# Patient Record
Sex: Male | Born: 1955 | Race: White | Hispanic: No | Marital: Single | Smoking: Former smoker
Health system: Southern US, Community
[De-identification: ages and names within clinical notes are randomized; demographics above are authoritative.]

## PROBLEM LIST (undated history)

## (undated) DIAGNOSIS — K219 Gastro-esophageal reflux disease without esophagitis: Secondary | ICD-10-CM

## (undated) DIAGNOSIS — I10 Essential (primary) hypertension: Secondary | ICD-10-CM

## (undated) DIAGNOSIS — M1990 Unspecified osteoarthritis, unspecified site: Secondary | ICD-10-CM

## (undated) DIAGNOSIS — R200 Anesthesia of skin: Secondary | ICD-10-CM

## (undated) DIAGNOSIS — Z972 Presence of dental prosthetic device (complete) (partial): Secondary | ICD-10-CM

## (undated) HISTORY — PX: HERNIA REPAIR: SHX51

---

## 2008-01-26 ENCOUNTER — Other Ambulatory Visit: Payer: Self-pay

## 2008-01-26 ENCOUNTER — Emergency Department: Payer: Self-pay | Admitting: Emergency Medicine

## 2008-08-09 ENCOUNTER — Ambulatory Visit: Payer: Self-pay | Admitting: Internal Medicine

## 2009-06-22 ENCOUNTER — Emergency Department: Payer: Self-pay | Admitting: Emergency Medicine

## 2009-08-20 HISTORY — PX: COLONOSCOPY: SHX174

## 2013-11-28 ENCOUNTER — Ambulatory Visit: Payer: Self-pay

## 2014-03-12 ENCOUNTER — Ambulatory Visit: Payer: Self-pay | Admitting: Physician Assistant

## 2014-03-12 LAB — CBC WITH DIFFERENTIAL/PLATELET
BASOS PCT: 1.1 %
Basophil #: 0.1 10*3/uL (ref 0.0–0.1)
EOS ABS: 0 10*3/uL (ref 0.0–0.7)
EOS PCT: 0.5 %
HCT: 49.5 % (ref 40.0–52.0)
HGB: 17.2 g/dL (ref 13.0–18.0)
LYMPHS PCT: 23.2 %
Lymphocyte #: 1.2 10*3/uL (ref 1.0–3.6)
MCH: 32.4 pg (ref 26.0–34.0)
MCHC: 34.8 g/dL (ref 32.0–36.0)
MCV: 93 fL (ref 80–100)
Monocyte #: 0.9 x10 3/mm (ref 0.2–1.0)
Monocyte %: 17.2 %
Neutrophil #: 3 10*3/uL (ref 1.4–6.5)
Neutrophil %: 58 %
PLATELETS: 149 10*3/uL — AB (ref 150–440)
RBC: 5.3 10*6/uL (ref 4.40–5.90)
RDW: 12.7 % (ref 11.5–14.5)
WBC: 5.2 10*3/uL (ref 3.8–10.6)

## 2014-03-12 LAB — RAPID STREP-A WITH REFLX: Micro Text Report: POSITIVE

## 2014-03-12 LAB — MONONUCLEOSIS SCREEN: Mono Test: NEGATIVE

## 2014-04-21 ENCOUNTER — Ambulatory Visit: Payer: Self-pay

## 2014-04-21 LAB — DOT URINE DIP
BLOOD: NEGATIVE
Glucose,UR: NEGATIVE
Protein: NEGATIVE
Specific Gravity: 1.02 (ref 1.000–1.030)

## 2015-04-15 ENCOUNTER — Ambulatory Visit
Admission: EM | Admit: 2015-04-15 | Discharge: 2015-04-15 | Disposition: A | Discharge status: Home / Self Care | Payer: Self-pay | Attending: Family Medicine | Admitting: Family Medicine

## 2015-04-15 DIAGNOSIS — Z0289 Encounter for other administrative examinations: Secondary | ICD-10-CM

## 2015-04-15 HISTORY — DX: Essential (primary) hypertension: I10

## 2015-04-15 LAB — DEPT OF TRANSP DIPSTICK, URINE (ARMC ONLY)
GLUCOSE, UA: NEGATIVE mg/dL
Hgb urine dipstick: NEGATIVE
Protein, ur: NEGATIVE mg/dL
SPECIFIC GRAVITY, URINE: 1.025 (ref 1.005–1.030)

## 2015-04-15 NOTE — ED Notes (Signed)
Pt states "I am here for DOT physical."

## 2015-04-15 NOTE — ED Provider Notes (Signed)
CSN: [id number]     Arrival date & time 04/15/15  1059 History   First MD Initiated Contact with Patient 04/15/15 1146     Chief Complaint  Patient presents with  . Commercial Driver's License Exam   (Consider location/radiation/quality/duration/timing/severity/associated sxs/prior Treatment) HPI Comments: Patient here for DOT Physical (see scanned form)   The history is provided by the patient.    Past Medical History  Diagnosis Date  . Hypertension    Past Surgical History  Procedure Laterality Date  . Hernia repair     History reviewed. No pertinent family history. Social History  Substance Use Topics  . Smoking status: Never Smoker   . Smokeless tobacco: None  . Alcohol Use: Yes     Comment: social    Review of Systems  Allergies  Review of patient's allergies indicates no known allergies.  Home Medications   Prior to Admission medications   Not on File   Meds Ordered and Administered this Visit  Medications - No data to display  BP 136/84 mmHg  Pulse 78  Temp(Src) 98.2 F (36.8 C) (Tympanic)  Resp 16  Ht 6\' 1"  (1.854 m)  Wt 223 lb (101.152 kg)  BMI 29.43 kg/m2 No data found.   Physical Exam  ED Course  Procedures (including critical care time)  Labs Review Labs Reviewed  DEPT OF TRANSP DIPSTICK, URINE([facility] ONLY)    Imaging Review No results found.   Visual Acuity Review  Right Eye Distance:   Left Eye Distance:   Bilateral Distance:    Right Eye Near:   Left Eye Near:    Bilateral Near:         MDM   1. Encounter for examination required by Department of Transportation (DOT)    DOT Physical (medically qualified for 1 year; see scanned form)    Norval Gable, MD 04/15/15 1222

## 2015-08-01 ENCOUNTER — Other Ambulatory Visit: Payer: Self-pay | Admitting: Family Medicine

## 2015-08-01 ENCOUNTER — Ambulatory Visit (INDEPENDENT_AMBULATORY_CARE_PROVIDER_SITE_OTHER): Payer: BLUE CROSS/BLUE SHIELD | Admitting: Family Medicine

## 2015-08-01 ENCOUNTER — Ambulatory Visit
Admission: RE | Admit: 2015-08-01 | Discharge: 2015-08-01 | Disposition: A | Discharge status: Home / Self Care | Payer: BLUE CROSS/BLUE SHIELD | Source: Ambulatory Visit | Attending: Family Medicine | Admitting: Family Medicine

## 2015-08-01 ENCOUNTER — Encounter: Payer: Self-pay | Admitting: Family Medicine

## 2015-08-01 VITALS — BP 140/98 | HR 80 | Ht 73.0 in | Wt 227.0 lb

## 2015-08-01 DIAGNOSIS — N400 Enlarged prostate without lower urinary tract symptoms: Secondary | ICD-10-CM | POA: Diagnosis not present

## 2015-08-01 DIAGNOSIS — Z720 Tobacco use: Secondary | ICD-10-CM

## 2015-08-01 DIAGNOSIS — J029 Acute pharyngitis, unspecified: Secondary | ICD-10-CM

## 2015-08-01 DIAGNOSIS — R05 Cough: Secondary | ICD-10-CM | POA: Diagnosis present

## 2015-08-01 DIAGNOSIS — M542 Cervicalgia: Secondary | ICD-10-CM

## 2015-08-01 DIAGNOSIS — A630 Anogenital (venereal) warts: Secondary | ICD-10-CM | POA: Diagnosis not present

## 2015-08-01 DIAGNOSIS — M79602 Pain in left arm: Secondary | ICD-10-CM

## 2015-08-01 DIAGNOSIS — M79603 Pain in arm, unspecified: Secondary | ICD-10-CM

## 2015-08-01 DIAGNOSIS — Z23 Encounter for immunization: Secondary | ICD-10-CM | POA: Diagnosis not present

## 2015-08-01 DIAGNOSIS — M79601 Pain in right arm: Secondary | ICD-10-CM | POA: Insufficient documentation

## 2015-08-01 DIAGNOSIS — E663 Overweight: Secondary | ICD-10-CM

## 2015-08-01 DIAGNOSIS — F17200 Nicotine dependence, unspecified, uncomplicated: Secondary | ICD-10-CM

## 2015-08-01 DIAGNOSIS — K635 Polyp of colon: Secondary | ICD-10-CM

## 2015-08-01 DIAGNOSIS — G8929 Other chronic pain: Secondary | ICD-10-CM

## 2015-08-01 DIAGNOSIS — Z8249 Family history of ischemic heart disease and other diseases of the circulatory system: Secondary | ICD-10-CM

## 2015-08-01 DIAGNOSIS — Z202 Contact with and (suspected) exposure to infections with a predominantly sexual mode of transmission: Secondary | ICD-10-CM | POA: Diagnosis not present

## 2015-08-01 DIAGNOSIS — R918 Other nonspecific abnormal finding of lung field: Secondary | ICD-10-CM | POA: Diagnosis not present

## 2015-08-01 DIAGNOSIS — I10 Essential (primary) hypertension: Secondary | ICD-10-CM | POA: Diagnosis not present

## 2015-08-01 DIAGNOSIS — Z8349 Family history of other endocrine, nutritional and metabolic diseases: Secondary | ICD-10-CM

## 2015-08-01 DIAGNOSIS — R202 Paresthesia of skin: Secondary | ICD-10-CM

## 2015-08-01 DIAGNOSIS — Z8639 Personal history of other endocrine, nutritional and metabolic disease: Secondary | ICD-10-CM

## 2015-08-01 DIAGNOSIS — R053 Chronic cough: Secondary | ICD-10-CM

## 2015-08-01 DIAGNOSIS — E559 Vitamin D deficiency, unspecified: Secondary | ICD-10-CM

## 2015-08-01 DIAGNOSIS — R748 Abnormal levels of other serum enzymes: Secondary | ICD-10-CM

## 2015-08-01 MED ORDER — AZITHROMYCIN 250 MG PO TABS: ORAL_TABLET | ORAL | Status: DC

## 2015-08-01 MED ORDER — LOSARTAN POTASSIUM 100 MG PO TABS: 100.0000 mg | ORAL_TABLET | Freq: Every day | ORAL | Status: DC

## 2015-08-01 MED ORDER — ZOSTER VACCINE LIVE 19400 UNT/0.65ML ~~LOC~~ SOLR: 0.6500 mL | Freq: Once | SUBCUTANEOUS | Status: DC

## 2015-08-01 NOTE — Progress Notes (Signed)
Date:  08/01/2015   Name:  Randall Bell   DOB:  Nov 25, 1955   MRN:  [id number]  PCP:  No primary care provider on file.    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 Hx HTN on losartan past 2 yrs (intolerant lisinopril) with good control but off now x 2wks. Quit smoking for 6 years but has restarted, now smoking 2ppd but plans to quit again with patches. C/o persistent neck pain with occ radiation BUE, hands fall asleep if drives too long without moving, chiro made worse. C/o intermittent L lung numbness and pain, persistent cough past 6 months, no recent CXR. Hx BPH intolerant Flomax in past, nocturia stable. Seen [facility] for strep throat 2 months ago, throat still sore. Takes ibuprofen 1600 mg most days. Hx HLD on omega-3 in past but not now. Concerned re: recent possible STD exposure, hx recurrent genital warts. Occ BLE edema with prolonged sitting. Tetanus status unknown, needs flu and zoster imms, had colonoscopy 2011, for 5 yr f/u due to polyps.  Review of Systems:  Review of Systems  Constitutional: Negative for fever and chills.  HENT: Negative for ear pain and trouble swallowing.   Eyes: Negative for pain.  Respiratory: Negative for shortness of breath.   Gastrointestinal: Negative for abdominal pain.  Endocrine: Negative for polyuria.  Genitourinary: Negative for difficulty urinating.  Neurological: Negative for syncope and light-headedness.  Psychiatric/Behavioral: Negative for sleep disturbance.    Patient Active Problem List   Diagnosis Date Noted  . Overweight (BMI 25.0-29.9) 08/01/2015  . Hypertension 08/01/2015  . Smoker 08/01/2015  . BPH (benign prostatic hypertrophy) 08/01/2015  . Genital warts 08/01/2015  . Paresthesia and pain of both upper extremities 08/01/2015  . Neck pain of over 3 months duration 08/01/2015  . Persistent cough 08/01/2015  . FH: heart disease  08/01/2015  . FH: alpha 1 antitrypsin deficiency 08/01/2015    Prior to Admission medications   Medication Sig Start Date End Date Taking? Authorizing Provider  losartan (COZAAR) 100 MG tablet Take 1 tablet (100 mg total) by mouth daily. 08/01/15  Yes Adline Potter, MD  Multiple Vitamins-Minerals (CENTRUM SILVER ADULT 50+ PO) Take 1 tablet by mouth daily.   Yes Historical Provider, MD  vitamin C (ASCORBIC ACID) 500 MG tablet Take 500 mg by mouth daily.   Yes Historical Provider, MD  zoster vaccine live, PF, (ZOSTAVAX) 09811 UNT/0.65ML injection Inject 19,400 Units into the skin once. 08/01/15   Adline Potter, MD    No Known Allergies  Past Surgical History  Procedure Laterality Date  . Hernia repair    . Colonoscopy  2011    repeat in 5 yrs/ polyps- [facility] docs    Social History  Substance Use Topics  . Smoking status: Current Every Day Smoker  . Smokeless tobacco: None  . Alcohol Use: 0.0 oz/week    0 Standard drinks or equivalent per week     Comment: social    No family history on file.  Medication list has been reviewed and updated.  Physical Examination: BP 140/98 mmHg  Pulse 80  Ht 6\' 1"  (1.854 m)  Wt 227 lb (102.967 kg)  BMI 29.96 kg/m2  Physical Exam  Constitutional: He is oriented to person, place, and time. He appears well-developed and well-nourished.  HENT:  Head: Normocephalic and atraumatic.  Right Ear: External ear normal.  Left Ear: External ear normal.  Nose:  Nose normal.  Mouth/Throat: Oropharynx is clear and moist.  TM's clear  Eyes: Conjunctivae and EOM are normal. Pupils are equal, round, and reactive to light. No scleral icterus.  Neck: Neck supple. No thyromegaly present.  Cardiovascular: Normal rate, regular rhythm and normal heart sounds.   Pulmonary/Chest: Effort normal and breath sounds normal.  Abdominal: Soft. He exhibits no distension and no mass. There is no tenderness.  Genitourinary:  Small genital wart L ventral penis near  base Prostate 3+, no nodules  Musculoskeletal: He exhibits no edema.  Lymphadenopathy:    He has no cervical adenopathy.  Neurological: He is alert and oriented to person, place, and time. Coordination normal.  Romberg negative Gait normal  Skin: Skin is warm and dry.  Psychiatric: He has a normal mood and affect. His behavior is normal.  Nursing note and vitals reviewed.   Assessment and Plan:  1. Essential hypertension Poor control off losartan, restart at previous dose, recheck BP 1 month - Comprehensive Metabolic Panel (CMET) - CBC  2. Overweight (BMI 25.0-29.9) Discussed exercise/weight loss - TSH - Vitamin D (25 hydroxy) - Lipid Profile  3. Smoker Encouraged cessation  4. BPH (benign prostatic hypertrophy) Sx stable, intolerant Flomax, monitor  5. Sore throat Persistent - Monospot  6. Paresthesia and pain of both upper extremities May be related to cervical OA - B12  7. Possible exposure to STD - HIV antibody (with reflex) - GC/Chlamydia Probe Amp  8. Persistent cough In smoker - DG Chest 2 View; Future  9. Neck pain of over 3 months duration Suspect OA, consider PT referral - DG Cervical Spine Complete; Future  10. Need for influenza vaccination - Flu Vaccine QUAD 36+ mos PF IM (Fluarix & Fluzone Quad PF)  11. Genital warts Recurrent, discussed rx, consider cryo next visit  12. FH: heart disease  13. FH: alpha 1 antitrypsin deficiency  Return in about 4 weeks (around 08/29/2015).  [name]. Suriyah Vergara, [facility]  08/01/2015

## 2015-08-02 DIAGNOSIS — E559 Vitamin D deficiency, unspecified: Secondary | ICD-10-CM | POA: Insufficient documentation

## 2015-08-02 DIAGNOSIS — R748 Abnormal levels of other serum enzymes: Secondary | ICD-10-CM | POA: Insufficient documentation

## 2015-08-02 LAB — CBC
HEMATOCRIT: 47.7 % (ref 37.5–51.0)
HEMOGLOBIN: 16.4 g/dL (ref 12.6–17.7)
MCH: 32.3 pg (ref 26.6–33.0)
MCHC: 34.4 g/dL (ref 31.5–35.7)
MCV: 94 fL (ref 79–97)
PLATELETS: 256 10*3/uL (ref 150–379)
RBC: 5.08 x10E6/uL (ref 4.14–5.80)
RDW: 12.7 % (ref 12.3–15.4)
WBC: 4.7 10*3/uL (ref 3.4–10.8)

## 2015-08-02 LAB — MONONUCLEOSIS SCREEN: Mono Screen: NEGATIVE

## 2015-08-02 LAB — VITAMIN D 25 HYDROXY (VIT D DEFICIENCY, FRACTURES): Vit D, 25-Hydroxy: 25.1 ng/mL — ABNORMAL LOW (ref 30.0–100.0)

## 2015-08-02 LAB — VITAMIN B12: VITAMIN B 12: 315 pg/mL (ref 211–946)

## 2015-08-02 LAB — LIPID PANEL
Chol/HDL Ratio: 3.3 ratio units (ref 0.0–5.0)
Cholesterol, Total: 216 mg/dL — ABNORMAL HIGH (ref 100–199)
HDL: 65 mg/dL (ref >39)
LDL CALC: 132 mg/dL — AB (ref 0–99)
Triglycerides: 95 mg/dL (ref 0–149)
VLDL CHOLESTEROL CAL: 19 mg/dL (ref 5–40)

## 2015-08-02 LAB — COMPREHENSIVE METABOLIC PANEL
ALT: 48 IU/L — AB (ref 0–44)
AST: 49 IU/L — AB (ref 0–40)
Albumin/Globulin Ratio: 1.9 (ref 1.1–2.5)
Albumin: 4.7 g/dL (ref 3.6–4.8)
Alkaline Phosphatase: 60 IU/L (ref 39–117)
BUN/Creatinine Ratio: 19 (ref 10–22)
BUN: 19 mg/dL (ref 8–27)
Bilirubin Total: 0.6 mg/dL (ref 0.0–1.2)
CALCIUM: 9.8 mg/dL (ref 8.6–10.2)
CHLORIDE: 96 mmol/L (ref 96–106)
CO2: 22 mmol/L (ref 18–29)
Creatinine, Ser: 1 mg/dL (ref 0.76–1.27)
GFR, EST AFRICAN AMERICAN: 94 mL/min/{1.73_m2} (ref >59)
GFR, EST NON AFRICAN AMERICAN: 81 mL/min/{1.73_m2} (ref >59)
GLUCOSE: 91 mg/dL (ref 65–99)
Globulin, Total: 2.5 g/dL (ref 1.5–4.5)
POTASSIUM: 4.1 mmol/L (ref 3.5–5.2)
Sodium: 138 mmol/L (ref 134–144)
TOTAL PROTEIN: 7.2 g/dL (ref 6.0–8.5)

## 2015-08-02 LAB — HIV ANTIBODY (ROUTINE TESTING W REFLEX): HIV SCREEN 4TH GENERATION: NONREACTIVE

## 2015-08-02 LAB — TSH: TSH: 1.23 u[IU]/mL (ref 0.450–4.500)

## 2015-08-02 MED ORDER — VITAMIN D 50 MCG (2000 UT) PO CAPS: 1.0000 | ORAL_CAPSULE | Freq: Every day | ORAL | Status: DC

## 2015-08-02 NOTE — Addendum Note (Signed)
Addended by: Adline Potter on: 08/02/2015 10:17 AM   Modules accepted: Orders, SmartSet

## 2015-08-03 LAB — GC/CHLAMYDIA PROBE AMP
Chlamydia trachomatis, NAA: NEGATIVE
NEISSERIA GONORRHOEAE BY PCR: NEGATIVE

## 2015-08-08 ENCOUNTER — Ambulatory Visit: Payer: Self-pay | Admitting: Family Medicine

## 2015-08-16 ENCOUNTER — Other Ambulatory Visit: Payer: Self-pay

## 2015-08-17 ENCOUNTER — Other Ambulatory Visit: Payer: Self-pay | Admitting: Family Medicine

## 2015-08-17 DIAGNOSIS — R05 Cough: Secondary | ICD-10-CM

## 2015-08-17 DIAGNOSIS — R053 Chronic cough: Secondary | ICD-10-CM

## 2015-08-17 MED ORDER — DOXYCYCLINE HYCLATE 100 MG PO CAPS: 100.0000 mg | ORAL_CAPSULE | Freq: Two times a day (BID) | ORAL | Status: DC

## 2015-08-17 NOTE — Addendum Note (Signed)
Addended by: Adline Potter on: 08/17/2015 09:09 AM   Modules accepted: Orders, Medications

## 2015-08-17 NOTE — Progress Notes (Signed)
Needs f/u CXR after completes course of doxy (ordered).

## 2015-08-29 ENCOUNTER — Ambulatory Visit: Payer: BLUE CROSS/BLUE SHIELD | Admitting: Family Medicine

## 2015-08-30 ENCOUNTER — Encounter: Payer: Self-pay | Admitting: Family Medicine

## 2015-08-30 ENCOUNTER — Ambulatory Visit (INDEPENDENT_AMBULATORY_CARE_PROVIDER_SITE_OTHER): Payer: BLUE CROSS/BLUE SHIELD | Admitting: Family Medicine

## 2015-08-30 VITALS — BP 139/99 | HR 85 | Ht 73.0 in | Wt 232.0 lb

## 2015-08-30 DIAGNOSIS — E559 Vitamin D deficiency, unspecified: Secondary | ICD-10-CM

## 2015-08-30 DIAGNOSIS — I10 Essential (primary) hypertension: Secondary | ICD-10-CM

## 2015-08-30 DIAGNOSIS — R05 Cough: Secondary | ICD-10-CM | POA: Diagnosis not present

## 2015-08-30 DIAGNOSIS — R053 Chronic cough: Secondary | ICD-10-CM

## 2015-08-30 DIAGNOSIS — R748 Abnormal levels of other serum enzymes: Secondary | ICD-10-CM

## 2015-08-30 DIAGNOSIS — E663 Overweight: Secondary | ICD-10-CM | POA: Diagnosis not present

## 2015-08-30 DIAGNOSIS — R202 Paresthesia of skin: Secondary | ICD-10-CM

## 2015-08-30 DIAGNOSIS — M79603 Pain in arm, unspecified: Secondary | ICD-10-CM | POA: Diagnosis not present

## 2015-08-30 DIAGNOSIS — Z720 Tobacco use: Secondary | ICD-10-CM | POA: Diagnosis not present

## 2015-08-30 DIAGNOSIS — A630 Anogenital (venereal) warts: Secondary | ICD-10-CM

## 2015-08-30 DIAGNOSIS — F17200 Nicotine dependence, unspecified, uncomplicated: Secondary | ICD-10-CM

## 2015-08-30 DIAGNOSIS — M79601 Pain in right arm: Secondary | ICD-10-CM

## 2015-08-30 DIAGNOSIS — M79602 Pain in left arm: Secondary | ICD-10-CM

## 2015-08-30 NOTE — Progress Notes (Signed)
Date:  08/30/2015   Name:  Randall Bell   DOB:  [birth year]   MRN:  [id number]  PCP:  Adline Potter, MD    Chief Complaint: Follow-up and Hypertension   History of Present Illness:  This is a 60 y.o. male for f/u MMP. Taking losartan for BP past month only. Still smoking but trying to quit with nicotine replacement (successful in past). BUE paresthesias sl improved, C spine films showed mild spondylosis only. Pt bought home cryo unit for genital wart he plans to use soon. Cough about the same, still has 4 days of doxy left. States L lung hurts when he smokes. CXR showed RLL infiltrate, infectious vs. Inflammatory. Blood work showed elevated liver enzymes, recommended decrease etoh use and recheck, doesn't want checked today as drank heavily last night. VIt D level low, taking supplement.  Review of Systems:  Review of Systems  Constitutional: Negative for fever and fatigue.  Respiratory: Negative for shortness of breath.   Gastrointestinal: Negative for abdominal pain.  Neurological: Negative for syncope and light-headedness.    Patient Active Problem List   Diagnosis Date Noted  . Vitamin D deficiency 08/02/2015  . Elevated liver enzymes 08/02/2015  . Overweight (BMI 25.0-29.9) 08/01/2015  . Hypertension 08/01/2015  . Smoker 08/01/2015  . BPH (benign prostatic hypertrophy) 08/01/2015  . Genital warts 08/01/2015  . Paresthesia and pain of both upper extremities 08/01/2015  . Neck pain of over 3 months duration 08/01/2015  . Persistent cough 08/01/2015  . FH: heart disease 08/01/2015  . FH: alpha 1 antitrypsin deficiency 08/01/2015  . Colon polyps 08/01/2015  . Hx of hyperlipidemia 08/01/2015    Prior to Admission medications   Medication Sig Start Date End Date Taking? Authorizing Provider  Cholecalciferol (VITAMIN D) 2000 UNITS CAPS Take 1 capsule (2,000 Units total) by mouth daily. 08/02/15  Yes Adline Potter, MD  doxycycline (VIBRAMYCIN) 100 MG capsule Take 1 capsule  (100 mg total) by mouth 2 (two) times daily. 08/17/15  Yes Adline Potter, MD  ibuprofen (ADVIL,MOTRIN) 200 MG tablet Take 400 mg by mouth every 6 (six) hours as needed.   Yes Historical Provider, MD  losartan (COZAAR) 100 MG tablet Take 1 tablet (100 mg total) by mouth daily. 08/01/15  Yes Adline Potter, MD  Multiple Vitamins-Minerals (CENTRUM SILVER ADULT 50+ PO) Take 1 tablet by mouth daily.   Yes Historical Provider, MD  vitamin C (ASCORBIC ACID) 500 MG tablet Take 500 mg by mouth daily.   Yes Historical Provider, MD    Allergies  Allergen Reactions  . Flomax [Tamsulosin Hcl] Other (See Comments)    Sexual dysfunction  . Lisinopril Other (See Comments)    Dizziness and arthralgias    Past Surgical History  Procedure Laterality Date  . Hernia repair    . Colonoscopy  2011    repeat in 5 yrs/ polyps- [facility] docs    Social History  Substance Use Topics  . Smoking status: Current Every Day Smoker  . Smokeless tobacco: None  . Alcohol Use: 0.0 oz/week    0 Standard drinks or equivalent per week     Comment: social    Family History  Problem Relation Age of Onset  . Heart disease Father   . Breast cancer Mother     Medication list has been reviewed and updated.  Physical Examination: BP 139/99 mmHg  Pulse 85  Ht 6\' 1"  (1.854 m)  Wt 232 lb (105.235 kg)  BMI 30.62 kg/m2  Physical Exam  Constitutional:  He appears well-developed and well-nourished.  Cardiovascular: Normal rate, regular rhythm and normal heart sounds.   Pulmonary/Chest: Effort normal and breath sounds normal.  Musculoskeletal: He exhibits no edema.  Neurological: He is alert.  Skin: Skin is warm and dry.  Psychiatric: He has a normal mood and affect. His behavior is normal.  Nursing note and vitals reviewed.   Assessment and Plan:  1. Essential hypertension Improved on losartan, continue current dose  2. Genital warts Pt to try home cryo, RTO for LN2 if ineffective  3. Persistent cough RLL  infiltrate on CXR, repeat s/p abx, consider chest CT if persists - DG Chest 2 View; Future  4. Vitamin D deficiency On supplement, recheck level - Vitamin D (25 hydroxy); Future  5. Elevated liver enzymes Recheck with decreased alcohol use, consider [place] if persists - Hepatic function panel; Future  6. Smoker Encouraged cessation  7. Paresthesia and pain of both upper extremities Likely due to cervical OA, monitor  8. Overweight (BMI 25.0-29.9) Exercise/weight loss discussed  Return in about 3 months (around 11/28/2015).  [name]. [facility]  08/30/2015

## 2015-09-12 ENCOUNTER — Ambulatory Visit
Admission: RE | Admit: 2015-09-12 | Discharge: 2015-09-12 | Disposition: A | Discharge status: Home / Self Care | Payer: BLUE CROSS/BLUE SHIELD | Source: Ambulatory Visit | Attending: Family Medicine | Admitting: Family Medicine

## 2015-09-12 ENCOUNTER — Ambulatory Visit: Payer: BLUE CROSS/BLUE SHIELD | Admitting: Family Medicine

## 2015-09-12 DIAGNOSIS — R05 Cough: Secondary | ICD-10-CM | POA: Insufficient documentation

## 2015-09-12 DIAGNOSIS — R053 Chronic cough: Secondary | ICD-10-CM

## 2015-09-15 ENCOUNTER — Telehealth: Payer: Self-pay

## 2015-09-15 NOTE — Telephone Encounter (Signed)
Sent to [place] 

## 2015-09-15 NOTE — Telephone Encounter (Signed)
Needs to be seen in office

## 2015-09-19 ENCOUNTER — Encounter: Payer: Self-pay | Admitting: Family Medicine

## 2015-09-19 ENCOUNTER — Ambulatory Visit (INDEPENDENT_AMBULATORY_CARE_PROVIDER_SITE_OTHER): Payer: BLUE CROSS/BLUE SHIELD | Admitting: Family Medicine

## 2015-09-19 VITALS — BP 128/84 | HR 78 | Temp 98.4°F | Resp 16 | Ht 73.0 in | Wt 232.0 lb

## 2015-09-19 DIAGNOSIS — Z720 Tobacco use: Secondary | ICD-10-CM | POA: Diagnosis not present

## 2015-09-19 DIAGNOSIS — R748 Abnormal levels of other serum enzymes: Secondary | ICD-10-CM

## 2015-09-19 DIAGNOSIS — I10 Essential (primary) hypertension: Secondary | ICD-10-CM

## 2015-09-19 DIAGNOSIS — F17200 Nicotine dependence, unspecified, uncomplicated: Secondary | ICD-10-CM

## 2015-09-19 DIAGNOSIS — M25512 Pain in left shoulder: Secondary | ICD-10-CM

## 2015-09-19 DIAGNOSIS — E559 Vitamin D deficiency, unspecified: Secondary | ICD-10-CM | POA: Diagnosis not present

## 2015-09-21 NOTE — Progress Notes (Signed)
Date:  09/19/2015   Name:  Randall Bell   DOB:  05-Apr-1956   MRN:  [id number]  PCP:  Adline Potter, MD    Chief Complaint: Chest Pain   History of Present Illness:  This is a 60 y.o. male with L shoulder pain past several months, radiates to mid back, worse with lying down, non-exertional, L hand occ falls asleep while using cell phone, ibuprofen helps. Stopped smoking 18 days ago. Follow up CXR showed chronic scarring R base only. Taking vit D supp. Has cut back on drinking but still wants to defer repeat CMP given drinking over weekend.  Review of Systems:  Review of Systems  Respiratory: Negative for shortness of breath.   Cardiovascular: Negative for chest pain and leg swelling.  Neurological: Negative for syncope and light-headedness.    Patient Active Problem List   Diagnosis Date Noted  . Vitamin D deficiency 08/02/2015  . Elevated liver enzymes 08/02/2015  . Overweight (BMI 25.0-29.9) 08/01/2015  . Hypertension 08/01/2015  . Smoker 08/01/2015  . BPH (benign prostatic hypertrophy) 08/01/2015  . Genital warts 08/01/2015  . Paresthesia and pain of both upper extremities 08/01/2015  . Neck pain of over 3 months duration 08/01/2015  . Persistent cough 08/01/2015  . FH: heart disease 08/01/2015  . FH: alpha 1 antitrypsin deficiency 08/01/2015  . Colon polyps 08/01/2015    Prior to Admission medications   Medication Sig Start Date End Date Taking? Authorizing Provider  Cholecalciferol (VITAMIN D) 2000 UNITS CAPS Take 1 capsule (2,000 Units total) by mouth daily. 08/02/15  Yes Adline Potter, MD  ibuprofen (ADVIL,MOTRIN) 200 MG tablet Take 400 mg by mouth every 6 (six) hours as needed.   Yes Historical Provider, MD  losartan (COZAAR) 100 MG tablet Take 1 tablet (100 mg total) by mouth daily. 08/01/15  Yes Adline Potter, MD  Multiple Vitamins-Minerals (CENTRUM SILVER ADULT 50+ PO) Take 1 tablet by mouth daily.   Yes Historical Provider, MD  vitamin C (ASCORBIC ACID) 500  MG tablet Take 500 mg by mouth daily.   Yes Historical Provider, MD    Allergies  Allergen Reactions  . Flomax [Tamsulosin Hcl] Other (See Comments)    Sexual dysfunction  . Lisinopril Other (See Comments)    Dizziness and arthralgias    Past Surgical History  Procedure Laterality Date  . Hernia repair    . Colonoscopy  2011    repeat in 5 yrs/ polyps- [facility] docs    Social History  Substance Use Topics  . Smoking status: Current Every Day Smoker  . Smokeless tobacco: None  . Alcohol Use: 0.0 oz/week    0 Standard drinks or equivalent per week     Comment: social    Family History  Problem Relation Age of Onset  . Heart disease Father   . Breast cancer Mother     Medication list has been reviewed and updated.  Physical Examination: BP 128/84 mmHg  Pulse 78  Temp(Src) 98.4 F (36.9 C)  Resp 16  Ht 6\' 1"  (1.854 m)  Wt 232 lb (105.235 kg)  BMI 30.62 kg/m2  SpO2 97%  Physical Exam  Constitutional: He appears well-developed and well-nourished.  Cardiovascular: Normal rate, regular rhythm and normal heart sounds.   Pulmonary/Chest: Effort normal and breath sounds normal.  Musculoskeletal: He exhibits no edema.  Tender L posterior rotator cuff, pain with abduction past 90 degrees, distal strength intact  Neurological: He is alert.  Skin: Skin is warm and dry.  Psychiatric: He  has a normal mood and affect. His behavior is normal.  Nursing note and vitals reviewed.   Assessment and Plan:  1. Left shoulder pain Suspect persistent rotator cuff tendonitis, declines PT - Ambulatory referral to Orthopedic Surgery  2. Essential hypertension Well controlled on losartan  3. Smoker Quit 12 d ago, encouraged continued cessation  4. Vitamin D deficiency On supplement, consider recheck level next visit  5. Elevated liver enzymes Plan recheck on decreased alcohol intake next visit  Return in about 4 weeks (around 10/17/2015).  [name]. [facility]  09/21/2015

## 2015-09-30 ENCOUNTER — Other Ambulatory Visit: Payer: Self-pay

## 2016-03-29 ENCOUNTER — Encounter: Payer: Self-pay | Admitting: *Deleted

## 2016-03-29 ENCOUNTER — Ambulatory Visit
Admission: EM | Admit: 2016-03-29 | Discharge: 2016-03-29 | Disposition: A | Discharge status: Home / Self Care | Payer: BLUE CROSS/BLUE SHIELD | Attending: Family Medicine | Admitting: Family Medicine

## 2016-03-29 DIAGNOSIS — Z024 Encounter for examination for driving license: Secondary | ICD-10-CM

## 2016-03-29 DIAGNOSIS — Z029 Encounter for administrative examinations, unspecified: Secondary | ICD-10-CM

## 2016-03-29 LAB — DEPT OF TRANSP DIPSTICK, URINE (ARMC ONLY)
GLUCOSE, UA: NEGATIVE mg/dL
HGB URINE DIPSTICK: NEGATIVE
PROTEIN: NEGATIVE mg/dL
SPECIFIC GRAVITY, URINE: 1.015 (ref 1.005–1.030)

## 2016-03-29 NOTE — ED Provider Notes (Signed)
CSN: [id number]     Arrival date & time 03/29/16  1033 History   None    Chief Complaint  Patient presents with  . 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 He has a history of hypertension that is well controlled on losartan. Private medical doctor is Dr. Vicente Masson  Past Medical History:  Diagnosis Date  . Hypertension    Past Surgical History:  Procedure Laterality Date  . COLONOSCOPY  2011   repeat in 5 yrs/ polyps- [employer]  . HERNIA REPAIR     Family History  Problem Relation Age of Onset  . Breast cancer Mother   . Heart disease Father    Social History  Substance Use Topics  . Smoking status: Current Every Day Smoker  . Smokeless tobacco: Never Used  . Alcohol use 0.0 oz/week     Comment: social    Review of Systems  All other systems reviewed and are negative.   Allergies  Flomax [tamsulosin hcl] and Lisinopril  Home Medications   Prior to Admission medications   Medication Sig Start Date End Date Taking? Authorizing Provider  Cholecalciferol (VITAMIN D) 2000 UNITS CAPS Take 1 capsule (2,000 Units total) by mouth daily. 08/02/15  Yes Adline Potter, MD  ibuprofen (ADVIL,MOTRIN) 200 MG tablet Take 400 mg by mouth every 6 (six) hours as needed.   Yes Historical Provider, MD  losartan (COZAAR) 100 MG tablet Take 1 tablet (100 mg total) by mouth daily. 08/01/15  Yes Adline Potter, MD  Multiple Vitamins-Minerals (CENTRUM SILVER ADULT 50+ PO) Take 1 tablet by mouth daily.   Yes Historical Provider, MD  vitamin C (ASCORBIC ACID) 500 MG tablet Take 500 mg by mouth daily.   Yes Historical Provider, MD   Meds Ordered and Administered this Visit  Medications - No data to display  BP 132/81 (BP Location: Left Arm)   Pulse 71   Temp 97.8 F (36.6 C) (Oral)   Resp 16   Ht 6\' 1"  (1.854 m)   Wt 245 lb (111.1 kg)   SpO2 98%   BMI 32.32  kg/m  No data found.   Physical Exam  Constitutional:  Refer to DOT physical sheet  Nursing note and vitals reviewed.   Urgent Care Course   Clinical Course    Procedures (including critical care time)  Labs Review Labs Reviewed  DEPT OF TRANSP DIPSTICK, URINE([facility] ONLY)    Imaging Review No results found.   Visual Acuity Review  Right Eye Distance:   Left Eye Distance:   Bilateral Distance:    Right Eye Near:   Left Eye Near:    Bilateral Near:         MDM   1. Driver's permit PE (physical examination)    Patient qualifies for a 1 year certificate    Lorin Picket, PA-C 03/29/16 1204

## 2016-03-29 NOTE — ED Triage Notes (Signed)
DOT physical. 

## 2016-04-08 ENCOUNTER — Ambulatory Visit
Admission: EM | Admit: 2016-04-08 | Discharge: 2016-04-08 | Discharge status: Against Medical Advice | Payer: BLUE CROSS/BLUE SHIELD | Attending: Family Medicine | Admitting: Family Medicine

## 2016-04-08 DIAGNOSIS — I10 Essential (primary) hypertension: Secondary | ICD-10-CM | POA: Insufficient documentation

## 2016-04-08 DIAGNOSIS — R079 Chest pain, unspecified: Secondary | ICD-10-CM | POA: Insufficient documentation

## 2016-04-08 DIAGNOSIS — Z87891 Personal history of nicotine dependence: Secondary | ICD-10-CM | POA: Diagnosis not present

## 2016-04-08 DIAGNOSIS — R61 Generalized hyperhidrosis: Secondary | ICD-10-CM | POA: Insufficient documentation

## 2016-04-08 DIAGNOSIS — R0789 Other chest pain: Secondary | ICD-10-CM

## 2016-04-08 DIAGNOSIS — M79602 Pain in left arm: Secondary | ICD-10-CM | POA: Insufficient documentation

## 2016-04-08 MED ORDER — ASPIRIN 81 MG PO CHEW
324.0000 mg | CHEWABLE_TABLET | Freq: Once | ORAL | Status: AC
  Administered 2016-04-08: 324 mg via ORAL

## 2016-04-08 NOTE — ED Triage Notes (Signed)
Patient complains of chest pain radiating into the left arm. Patient states that pain started approx 3 days ago. Patient reports drop in BP yesterday 90/50. Patient states that he has been having shortness of breath, feeling clammy, left arm numbness, nausea and vomiting.

## 2016-04-08 NOTE — Discharge Instructions (Signed)
Patient refused to go to the ER by EMS. Patient signed AMA. He states that he will go to the ER on his own at this time.

## 2016-04-08 NOTE — ED Provider Notes (Signed)
[facility]    CSN: [id number] Arrival date & time: 04/08/16  1120  First Provider Contact:  None       History   Chief Complaint Chief Complaint  Patient presents with  . Chest Pain    HPI Randall Bell is a 60 y.o. male.   HPI: Patient presents today with symptoms of substernal chest pain, left arm pain, nausea, diaphoresis, shortness of breath that he has had for 3 days. Currently he has no chest pain. He only says that he has some left arm pain at this time. He is currently not nauseated or diaphoretic or short of breath. He denies any abdominal pain or severe headache. He denies any known cardiac history besides hypertension. He is a former smoker. He does use alcohol occasionally on the weekends. He denies any illicit drug use. He denies any known history of diabetes.  Past Medical History:  Diagnosis Date  . Hypertension     Patient Active Problem List   Diagnosis Date Noted  . Vitamin D deficiency 08/02/2015  . Elevated liver enzymes 08/02/2015  . Overweight (BMI 25.0-29.9) 08/01/2015  . Hypertension 08/01/2015  . Smoker 08/01/2015  . BPH (benign prostatic hypertrophy) 08/01/2015  . Genital warts 08/01/2015  . Paresthesia and pain of both upper extremities 08/01/2015  . Neck pain of over 3 months duration 08/01/2015  . Persistent cough 08/01/2015  . FH: heart disease 08/01/2015  . FH: alpha 1 antitrypsin deficiency 08/01/2015  . Colon polyps 08/01/2015    Past Surgical History:  Procedure Laterality Date  . COLONOSCOPY  2011   repeat in 5 yrs/ polyps- [employer]  . HERNIA REPAIR         Home Medications    Prior to Admission medications   Medication Sig Start Date End Date Taking? Authorizing Provider  Cholecalciferol (VITAMIN D) 2000 UNITS CAPS Take 1 capsule (2,000 Units total) by mouth daily. 08/02/15  Yes Adline Potter, MD  ibuprofen (ADVIL,MOTRIN) 200 MG tablet Take 400 mg by mouth every 6 (six) hours as needed.   Yes Historical  Provider, MD  losartan (COZAAR) 100 MG tablet Take 1 tablet (100 mg total) by mouth daily. 08/01/15  Yes Adline Potter, MD  Multiple Vitamins-Minerals (CENTRUM SILVER ADULT 50+ PO) Take 1 tablet by mouth daily.   Yes Historical Provider, MD  vitamin C (ASCORBIC ACID) 500 MG tablet Take 500 mg by mouth daily.   Yes Historical Provider, MD    Family History Family History  Problem Relation Age of Onset  . Breast cancer Mother   . Heart disease Father     Social History Social History  Substance Use Topics  . Smoking status: Former Smoker    Quit date: 10/10/2015  . Smokeless tobacco: Never Used  . Alcohol use 0.0 oz/week     Comment: social     Allergies   Flomax [tamsulosin hcl] and Lisinopril   Review of Systems Review of Systems: Negative except mentioned above.   Physical Exam Triage Vital Signs ED Triage Vitals  Enc Vitals Group     BP 04/08/16 1123 (!) 151/98     Pulse Rate 04/08/16 1123 78     Resp 04/08/16 1123 16     Temp 04/08/16 1123 97.9 F (36.6 C)     Temp Source 04/08/16 1123 Oral     SpO2 04/08/16 1123 98 %     Weight 04/08/16 1127 245 lb (111.1 kg)     Height 04/08/16 1127 5\' 11"  (  1.803 m)     Head Circumference --      Peak Flow --      Pain Score 04/08/16 1128 6     Pain Loc --      Pain Edu? --      Excl. in [place]? --    No data found.   Updated Vital Signs BP (!) 151/98 (BP Location: Left Arm)   Pulse 78   Temp 97.9 F (36.6 C) (Oral)   Resp 16   Ht 5\' 11"  (1.803 m)   Wt 245 lb (111.1 kg)   SpO2 98%   BMI 34.17 kg/m      Physical Exam:  GENERAL: NAD, comfortable HEENT: no pharyngeal erythema, no exudate RESP: CTA B, no tenderness to chest wall CARD: RRR ABD: Positive bowel sounds, nontender, no rebound or guarding MSK: 5 out of 5 strength of extremities, negative Homans NEURO: CN II-XII grossly intact    UC Treatments / Results  Labs (all labs ordered are listed, but only abnormal results are displayed) Labs Reviewed -  No data to display  EKG  EKG Interpretation None     Normal sinus rhythm, heart rate of 71, no ST elevations or depressions noted, P waves present.  Radiology No results found.  Procedures Procedures (including critical care time)  Medications Ordered in UC Medications - No data to display   Initial Impression / Assessment and Plan / UC Course  I have reviewed the triage vital signs and the nursing notes.  Pertinent labs & imaging results that were available during my care of the patient were reviewed by me and considered in my medical decision making (see chart for details).  Clinical Course   A/P: Hx of chest pain, left arm pain, diaphoresis, currently with only left arm pain- given patient's history recommended that patient go to the ER for further evaluation and treatment, I recommended that EMS be called to take patient to the ER, patient refused this and signed AMA, aspirin 325 mg was given to the patient, patient states that he'll go to the ER on his own at this time.  Final Clinical Impressions(s) / UC Diagnoses   Final diagnoses:  None    New Prescriptions New Prescriptions   No medications on file     Paulina Fusi, MD 04/08/16 1142

## 2016-04-09 ENCOUNTER — Emergency Department: Payer: BLUE CROSS/BLUE SHIELD

## 2016-04-09 ENCOUNTER — Encounter: Payer: Self-pay | Admitting: Emergency Medicine

## 2016-04-09 ENCOUNTER — Emergency Department
Admission: EM | Admit: 2016-04-09 | Discharge: 2016-04-09 | Disposition: A | Discharge status: Home / Self Care | Payer: BLUE CROSS/BLUE SHIELD | Attending: Emergency Medicine | Admitting: Emergency Medicine

## 2016-04-09 DIAGNOSIS — R0789 Other chest pain: Secondary | ICD-10-CM

## 2016-04-09 DIAGNOSIS — Z79899 Other long term (current) drug therapy: Secondary | ICD-10-CM | POA: Insufficient documentation

## 2016-04-09 DIAGNOSIS — K219 Gastro-esophageal reflux disease without esophagitis: Secondary | ICD-10-CM | POA: Insufficient documentation

## 2016-04-09 DIAGNOSIS — I10 Essential (primary) hypertension: Secondary | ICD-10-CM | POA: Insufficient documentation

## 2016-04-09 DIAGNOSIS — Z87891 Personal history of nicotine dependence: Secondary | ICD-10-CM | POA: Diagnosis not present

## 2016-04-09 DIAGNOSIS — R079 Chest pain, unspecified: Secondary | ICD-10-CM | POA: Diagnosis not present

## 2016-04-09 LAB — CBC
HEMATOCRIT: 44.5 % (ref 40.0–52.0)
Hemoglobin: 15.7 g/dL (ref 13.0–18.0)
MCH: 32 pg (ref 26.0–34.0)
MCHC: 35.2 g/dL (ref 32.0–36.0)
MCV: 90.8 fL (ref 80.0–100.0)
PLATELETS: 179 10*3/uL (ref 150–440)
RBC: 4.9 MIL/uL (ref 4.40–5.90)
RDW: 12.2 % (ref 11.5–14.5)
WBC: 4.7 10*3/uL (ref 3.8–10.6)

## 2016-04-09 LAB — TROPONIN I: Troponin I: <0.03 ng/mL (ref <0.03)

## 2016-04-09 LAB — BASIC METABOLIC PANEL
Anion gap: 5 (ref 5–15)
BUN: 18 mg/dL (ref 6–20)
CHLORIDE: 105 mmol/L (ref 101–111)
CO2: 27 mmol/L (ref 22–32)
CREATININE: 1.15 mg/dL (ref 0.61–1.24)
Calcium: 9.3 mg/dL (ref 8.9–10.3)
GFR calc non Af Amer: >60 mL/min (ref >60)
Glucose, Bld: 107 mg/dL — ABNORMAL HIGH (ref 65–99)
POTASSIUM: 3.9 mmol/L (ref 3.5–5.1)
Sodium: 137 mmol/L (ref 135–145)

## 2016-04-09 NOTE — ED Provider Notes (Signed)
[facility] Emergency Department Provider Note ____________________________________________   I have reviewed the triage vital signs and the triage nursing note.  HISTORY  Chief Complaint Arm Pain and Chest Pain   Historian Patient  HPI Randall Bell is a 60 y.o. male here for left arm pain, nausea, indigestion, and chest pressure that has occurred over the past few days.  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   He presented to urgent care yesterday because his friend told him that indigestion symptoms could be heart related.  When he went to urgent care they referred him to ED, but he signed out [place] because he did not want to come to the ED at that point time. He is presenting this morning for evaluation.  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  He has some chronic left neck pain down into the left shoulder/arm that occurred several years ago after an overzealous chiropractic manipulation.  He states he has not had an MRI because he has not been referred by primary care physician because he has had several addresses/moves over the past few years, but he now does have a doctor, Dr. Vicente Masson.    Past Medical History:  Diagnosis Date  . Hypertension     Patient Active Problem List   Diagnosis Date Noted  . Vitamin D deficiency 08/02/2015  . Elevated liver enzymes 08/02/2015  . Overweight (BMI 25.0-29.9) 08/01/2015  . Hypertension 08/01/2015  . Smoker 08/01/2015  . BPH (benign prostatic hypertrophy) 08/01/2015  . Genital warts 08/01/2015  . Paresthesia and pain of both upper extremities 08/01/2015  . Neck pain of  over 3 months duration 08/01/2015  . Persistent cough 08/01/2015  . FH: heart disease 08/01/2015  . FH: alpha 1 antitrypsin deficiency 08/01/2015  . Colon polyps 08/01/2015    Past Surgical History:  Procedure Laterality Date  . COLONOSCOPY  2011   repeat in 5 yrs/ polyps- [employer]  . HERNIA REPAIR      Prior to Admission medications   Medication Sig Start Date End Date Taking? Authorizing Provider  Cholecalciferol (VITAMIN D) 2000 UNITS CAPS Take 1 capsule (2,000 Units total) by mouth daily. 08/02/15   Adline Potter, MD  ibuprofen (ADVIL,MOTRIN) 200 MG tablet Take 400 mg by mouth every 6 (six) hours as needed.    Historical Provider, MD  losartan (COZAAR) 100 MG tablet Take 1 tablet (100 mg total) by mouth daily. 08/01/15   Adline Potter, MD  Multiple Vitamins-Minerals (CENTRUM SILVER ADULT 50+ PO) Take 1 tablet by mouth daily.    Historical Provider, MD  vitamin C (ASCORBIC ACID) 500 MG tablet Take 500 mg by mouth daily.    Historical Provider, MD    Allergies  Allergen Reactions  . Flomax [Tamsulosin Hcl] Other (See Comments)    Sexual dysfunction  . Lisinopril Other (See Comments)    Dizziness and arthralgias    Family History  Problem Relation Age of Onset  . Breast cancer Mother   . Heart disease Father     Social History Social History  Substance Use Topics  . Smoking status:  Former Smoker    Quit date: 10/10/2015  . Smokeless tobacco: Never Used  . Alcohol use 0.0 oz/week     Comment: social    Review of Systems  Constitutional: Negative for fever. Eyes: Negative for visual changes. ENT: Negative for sore throat. Cardiovascular: Negative for Current chest pain. Respiratory: Negative for shortness of breath. Gastrointestinal: Negative for abdominal pain, vomiting and diarrhea. Genitourinary: Negative for dysuria. Musculoskeletal: Negative for back pain. Skin: Negative for rash. Neurological: Negative for headache. 10 point Review of Systems otherwise  negative ____________________________________________   PHYSICAL EXAM:  VITAL SIGNS: ED Triage Vitals  Enc Vitals Group     BP 04/09/16 1005 138/89     Pulse Rate 04/09/16 1005 64     Resp 04/09/16 1005 18     Temp 04/09/16 1005 98.1 F (36.7 C)     Temp Source 04/09/16 1005 Oral     SpO2 04/09/16 1005 97 %     Weight 04/09/16 1005 240 lb (108.9 kg)     Height 04/09/16 1005 6\' 1"  (1.854 m)     Head Circumference --      Peak Flow --      Pain Score 04/09/16 1007 6     Pain Loc --      Pain Edu? --      Excl. in [place]? --      Constitutional: Alert and oriented. Well appearing and in no distress. HEENT   Head: Normocephalic and atraumatic.      Eyes: Conjunctivae are normal. PERRL. Normal extraocular movements.      Ears:         Nose: No congestion/rhinnorhea.   Mouth/Throat: Mucous membranes are moist.   Neck: No stridor. Cardiovascular/Chest: Normal rate, regular rhythm.  No murmurs, rubs, or gallops. Respiratory: Normal respiratory effort without tachypnea nor retractions. Breath sounds are clear and equal bilaterally. No wheezes/rales/rhonchi. Gastrointestinal: Soft. No distention, no guarding, no rebound. Nontender.    Genitourinary/rectal:Deferred Musculoskeletal: Nontender with normal range of motion in all extremities. No joint effusions.  No lower extremity tenderness.  No edema. Neurologic:  Normal speech and language. No gross or focal neurologic deficits are appreciated. Skin:  Skin is warm, dry and intact. No rash noted. Psychiatric: Mood and affect are normal. Speech and behavior are normal. Patient exhibits appropriate insight and judgment.  ____________________________________________   EKG I, Lisa Roca, MD, the attending physician have personally viewed and interpreted all ECGs.  65 bpm. Normal sinus rhythm. Narrow QRS. Normal axis. Normal ST and T-wave. There is a Q waves septally ____________________________________________  LABS  (pertinent positives/negatives)  Labs Reviewed  BASIC METABOLIC PANEL - Abnormal; Notable for the following:       Result Value   Glucose, Bld 107 (*)    All other components within normal limits  CBC  TROPONIN I    ____________________________________________  RADIOLOGY All Xrays were viewed by me. Imaging interpreted by Radiologist.  Chest two-view x-ray:  No active cardiopulmonary disease. In scarring at the right base posteriorly  __________________________________________  PROCEDURES  Procedure(s) performed: None  Critical Care performed: None  ____________________________________________   ED COURSE / ASSESSMENT AND PLAN  Pertinent labs & imaging results that were available during my care of the patient were reviewed by me and considered in my medical decision making (see chart for details).   Mr. Huey Bienenstock is here on recommendation from an urgent care visit yesterday due to chest discomfort and some associated symptoms over the past several days. He is  a symptomatic now. He thinks his symptoms are due to indigestion, and given his reassuring cardiac evaluation, I too think his symptoms probably are due to indigestion.  I am going to refer him to cardiology for possible stress testing.  In terms of the left arm symptoms, sound like this is essentially chronic, and he should speak with his primary care physician about possible MRI imaging.   CONSULTATIONS:   None   Patient / Family / Caregiver informed of clinical course, medical decision-making process, and agree with plan.   I discussed return precautions, follow-up instructions, and discharged instructions with patient and/or family.   ___________________________________________   FINAL CLINICAL IMPRESSION(S) / ED DIAGNOSES   Final diagnoses:  Gastroesophageal reflux disease, esophagitis presence not specified  Atypical chest pain              Note: This dictation was prepared with Dragon  dictation. Any transcriptional errors that result from this process are unintentional    Lisa Roca, MD 04/09/16 1340

## 2016-04-09 NOTE — ED Notes (Signed)
Patient states he had refused his chest xray, therefore no result. Patient agrees to have chest xray now prior to discharge.

## 2016-04-09 NOTE — ED Notes (Signed)
Discharge instructions given to patient.

## 2016-04-09 NOTE — ED Triage Notes (Signed)
Patient presents to the ED with left chest pain radiating down his left arm.  Patient was seen at [facility] and instructed to come to the ED for further evaluation.  Patient arrived to ED by private vehicle.  Patient reports slight shortness of breath but denies severe chest pain, ambulatory to triage without distress.

## 2016-04-09 NOTE — Discharge Instructions (Signed)
Although no certain cause was found for your chest discomfort today, you examine evaluation are reassuring in the emergency department. As we discussed, I do think that your symptoms are probably coming from acid reflux/indigestion.  However, you do need to call cardiologist, Dr. Nehemiah Massed, to make a follow-up appointment.  In terms of your pinched nerve in the neck, I discussed with your primary care physician next status which might include referral for surgical evaluation, and/or MRI imaging.  Return to the emergency department for any worsening condition including chest pain certainly associated with nausea, sweats, trouble breathing, fever, dizziness or passing out, or any other symptoms concerning to you.

## 2016-04-09 NOTE — ED Notes (Signed)
Pt refused chest x-ray

## 2016-04-10 DIAGNOSIS — R079 Chest pain, unspecified: Secondary | ICD-10-CM | POA: Diagnosis not present

## 2016-04-10 DIAGNOSIS — R0602 Shortness of breath: Secondary | ICD-10-CM | POA: Diagnosis not present

## 2016-04-10 DIAGNOSIS — I10 Essential (primary) hypertension: Secondary | ICD-10-CM | POA: Diagnosis not present

## 2016-04-10 DIAGNOSIS — I208 Other forms of angina pectoris: Secondary | ICD-10-CM | POA: Diagnosis not present

## 2016-04-13 DIAGNOSIS — R0602 Shortness of breath: Secondary | ICD-10-CM | POA: Diagnosis not present

## 2016-04-13 DIAGNOSIS — I208 Other forms of angina pectoris: Secondary | ICD-10-CM | POA: Diagnosis not present

## 2016-06-13 DIAGNOSIS — Z23 Encounter for immunization: Secondary | ICD-10-CM | POA: Diagnosis not present

## 2016-06-17 DIAGNOSIS — Z23 Encounter for immunization: Secondary | ICD-10-CM | POA: Diagnosis not present

## 2016-07-30 DIAGNOSIS — Z1281 Encounter for screening for malignant neoplasm of oral cavity: Secondary | ICD-10-CM | POA: Diagnosis not present

## 2016-07-30 DIAGNOSIS — K030 Excessive attrition of teeth: Secondary | ICD-10-CM | POA: Diagnosis not present

## 2016-07-30 DIAGNOSIS — K029 Dental caries, unspecified: Secondary | ICD-10-CM | POA: Diagnosis not present

## 2016-09-07 ENCOUNTER — Ambulatory Visit (INDEPENDENT_AMBULATORY_CARE_PROVIDER_SITE_OTHER): Payer: BLUE CROSS/BLUE SHIELD | Admitting: Family Medicine

## 2016-09-07 ENCOUNTER — Encounter: Payer: Self-pay | Admitting: Family Medicine

## 2016-09-07 VITALS — BP 132/78 | HR 95 | Temp 98.1°F | Ht 73.0 in | Wt 249.0 lb

## 2016-09-07 DIAGNOSIS — R748 Abnormal levels of other serum enzymes: Secondary | ICD-10-CM | POA: Diagnosis not present

## 2016-09-07 DIAGNOSIS — I10 Essential (primary) hypertension: Secondary | ICD-10-CM

## 2016-09-07 DIAGNOSIS — E559 Vitamin D deficiency, unspecified: Secondary | ICD-10-CM | POA: Diagnosis not present

## 2016-09-07 DIAGNOSIS — E669 Obesity, unspecified: Secondary | ICD-10-CM | POA: Diagnosis not present

## 2016-09-07 DIAGNOSIS — L03012 Cellulitis of left finger: Secondary | ICD-10-CM | POA: Diagnosis not present

## 2016-09-07 DIAGNOSIS — E66811 Obesity, class 1: Secondary | ICD-10-CM

## 2016-09-07 DIAGNOSIS — Z202 Contact with and (suspected) exposure to infections with a predominantly sexual mode of transmission: Secondary | ICD-10-CM

## 2016-09-07 DIAGNOSIS — K219 Gastro-esophageal reflux disease without esophagitis: Secondary | ICD-10-CM | POA: Diagnosis not present

## 2016-09-07 DIAGNOSIS — K635 Polyp of colon: Secondary | ICD-10-CM | POA: Diagnosis not present

## 2016-09-07 MED ORDER — SULFAMETHOXAZOLE-TRIMETHOPRIM 800-160 MG PO TABS: 1.0000 | ORAL_TABLET | Freq: Two times a day (BID) | ORAL | 0 refills | Status: DC

## 2016-09-07 MED ORDER — LOSARTAN POTASSIUM 100 MG PO TABS: 100.0000 mg | ORAL_TABLET | Freq: Every day | ORAL | 3 refills | Status: DC

## 2016-09-07 MED ORDER — VITAMIN D3 75 MCG (3000 UT) PO TABS: 1.0000 | ORAL_TABLET | Freq: Every day | ORAL | Status: AC

## 2016-09-07 NOTE — Progress Notes (Signed)
Date:  09/07/2016   Name:  Randall Bell   DOB:  [birth year]   MRN:  [id number]  PCP:  Adline Potter, MD    Chief Complaint: Hand Pain (Pt stated Lt 2nd finger is swollen, drainage, and painful)   History of Present Illness:  This is a 61 y.o. male seen in one year f/u. 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 Saw ortho for [place] pain, told DJD, seeing chiro, considering MRI, off Zanaflex/tramadol, taking ibuprofen [id number] mg daily. Taking vit D 3000 units daily as well as vit C, garlic, and krill oil. Seen ED in August for CP, cardiac w/u negative, told GERD, had inflamed stomach on EGD 15 years ago, daily omeprazole not controlling, also due for colonoscopy for polyps. Needs CMP to f/u elevated LFT's felt due to alcohol use. Requests HIV testing. Received flu imm this year.  Review of Systems:  Review of Systems  Constitutional: Negative for chills and fever.  Respiratory: Negative for cough and shortness of breath.   Cardiovascular: Negative for palpitations and leg swelling.  Genitourinary: Negative for difficulty urinating.  Neurological: Negative for syncope and light-headedness.  Hematological: Negative for adenopathy.    Patient Active Problem List   Diagnosis Date Noted  . Obesity (BMI 30.0-34.9) 09/07/2016  . GERD (gastroesophageal reflux disease) 09/07/2016  . Vitamin D deficiency 08/02/2015  . Elevated liver enzymes 08/02/2015  . Hypertension 08/01/2015  . BPH (benign prostatic hypertrophy) 08/01/2015  . Genital warts 08/01/2015  . Paresthesia and pain of both upper extremities 08/01/2015  . Neck pain of over 3 months duration 08/01/2015  . FH: heart disease 08/01/2015  . FH: alpha 1 antitrypsin deficiency 08/01/2015  . Colon polyps 08/01/2015    Prior to Admission medications   Medication Sig Start Date End Date Taking? Authorizing Provider  traMADol (ULTRAM) 50 MG  tablet Take by mouth. 09/23/15  Yes Historical Provider, MD  Cholecalciferol (VITAMIN D3) 3000 units TABS Take 1 tablet by mouth daily. 09/07/16   Adline Potter, MD  ibuprofen (ADVIL,MOTRIN) 200 MG tablet Take 400 mg by mouth every 6 (six) hours as needed.    Historical Provider, MD  losartan (COZAAR) 100 MG tablet Take 1 tablet (100 mg total) by mouth daily. 09/07/16   Adline Potter, MD  Multiple Vitamins-Minerals (CENTRUM SILVER ADULT 50+ PO) Take 1 tablet by mouth daily.    Historical Provider, MD  sulfamethoxazole-trimethoprim (BACTRIM DS,SEPTRA DS) 800-160 MG tablet Take 1 tablet by mouth 2 (two) times daily. 09/07/16   Adline Potter, MD  vitamin C (ASCORBIC ACID) 500 MG tablet Take 500 mg by mouth daily.    Historical Provider, MD    Allergies  Allergen Reactions  . Flomax [Tamsulosin Hcl] Other (See Comments)    Sexual dysfunction  . Lisinopril Other (See Comments)    Dizziness and arthralgias  . Tamsulosin Other (See Comments)    Sexual dysfunction    Past Surgical History:  Procedure Laterality Date  . COLONOSCOPY  2011   repeat in 5 yrs/ polyps- [employer]  . HERNIA REPAIR      Social History  Substance Use Topics  . Smoking status: Former Smoker    Quit date: 10/10/2015  . Smokeless tobacco: Never Used  . Alcohol use 0.0 oz/week     Comment: social    Family History  Problem Relation Age of Onset  . Breast cancer Mother   . Heart disease Father  Medication list has been reviewed and updated.  Physical Examination: BP 132/78   Pulse 95   Temp 98.1 F (36.7 C)   Ht 6\' 1"  (1.854 m)   Wt 249 lb (112.9 kg)   SpO2 96%   BMI 32.85 kg/m   Physical Exam  Constitutional: He appears well-developed and well-nourished.  Cardiovascular: Normal rate, regular rhythm and normal heart sounds.   Pulmonary/Chest: Effort normal and breath sounds normal.  Musculoskeletal: He exhibits no edema.  L index finger with ulcerated lesion over proximal phalanx with surrounding  erythema/edema extending to distal L hand dorsum, minimal purulent drainage  Neurological: He is alert.  Skin: Skin is warm and dry.  Psychiatric: He has a normal mood and affect. His behavior is normal.  Nursing note and vitals reviewed.   Assessment and Plan:  1. Cellulitis of left index finger Bactrim DS x 10 d, call sxs worsen or no better in 48h  2. Essential hypertension Well controlled on losartan  3. Polyp of colon, unspecified part of colon, unspecified type - Ambulatory referral to Gastroenterology  4. Obesity (BMI 30.0-34.9) Discussed exercise/weight loss  5. Elevated liver enzymes - Comprehensive Metabolic Panel (CMET)  6. Vitamin D deficiency On supplement - Vitamin D (25 hydroxy)  7. Possible exposure to STD - Hepatitis C Antibody - HIV antibody (with reflex)  8. Gastroesophageal reflux disease, esophagitis presence not specified Persistent sxs on PPI with hx gastritis - Ambulatory referral to Gastroenterology  Return in about 6 months (around 03/07/2017), or if infection symptoms worsen or fail to improve.  [name]. [facility]  09/07/2016

## 2016-09-08 LAB — COMPREHENSIVE METABOLIC PANEL
ALT: 56 IU/L — AB (ref 0–44)
AST: 40 IU/L (ref 0–40)
Albumin/Globulin Ratio: 2.1 (ref 1.2–2.2)
Albumin: 4.5 g/dL (ref 3.6–4.8)
Alkaline Phosphatase: 51 IU/L (ref 39–117)
BUN/Creatinine Ratio: 18 (ref 10–24)
BUN: 20 mg/dL (ref 8–27)
Bilirubin Total: 0.4 mg/dL (ref 0.0–1.2)
CALCIUM: 9.7 mg/dL (ref 8.6–10.2)
CO2: 22 mmol/L (ref 18–29)
Chloride: 100 mmol/L (ref 96–106)
Creatinine, Ser: 1.14 mg/dL (ref 0.76–1.27)
GFR, EST AFRICAN AMERICAN: 80 mL/min/{1.73_m2} (ref >59)
GFR, EST NON AFRICAN AMERICAN: 69 mL/min/{1.73_m2} (ref >59)
GLUCOSE: 93 mg/dL (ref 65–99)
Globulin, Total: 2.1 g/dL (ref 1.5–4.5)
Potassium: 4.3 mmol/L (ref 3.5–5.2)
Sodium: 140 mmol/L (ref 134–144)
TOTAL PROTEIN: 6.6 g/dL (ref 6.0–8.5)

## 2016-09-08 LAB — VITAMIN D 25 HYDROXY (VIT D DEFICIENCY, FRACTURES): Vit D, 25-Hydroxy: 32.1 ng/mL (ref 30.0–100.0)

## 2016-09-08 LAB — HEPATITIS C ANTIBODY

## 2016-09-08 LAB — HIV ANTIBODY (ROUTINE TESTING W REFLEX): HIV SCREEN 4TH GENERATION: NONREACTIVE

## 2016-09-10 ENCOUNTER — Ambulatory Visit (INDEPENDENT_AMBULATORY_CARE_PROVIDER_SITE_OTHER): Payer: BLUE CROSS/BLUE SHIELD | Admitting: Family Medicine

## 2016-09-10 ENCOUNTER — Ambulatory Visit
Admission: RE | Admit: 2016-09-10 | Discharge: 2016-09-10 | Disposition: A | Discharge status: Home / Self Care | Payer: BLUE CROSS/BLUE SHIELD | Source: Ambulatory Visit | Attending: Family Medicine | Admitting: Family Medicine

## 2016-09-10 ENCOUNTER — Encounter: Payer: Self-pay | Admitting: Family Medicine

## 2016-09-10 VITALS — BP 132/82 | HR 87 | Temp 97.9°F | Resp 16 | Ht 73.0 in | Wt 248.0 lb

## 2016-09-10 DIAGNOSIS — E559 Vitamin D deficiency, unspecified: Secondary | ICD-10-CM | POA: Diagnosis not present

## 2016-09-10 DIAGNOSIS — R748 Abnormal levels of other serum enzymes: Secondary | ICD-10-CM | POA: Diagnosis not present

## 2016-09-10 DIAGNOSIS — L03012 Cellulitis of left finger: Secondary | ICD-10-CM

## 2016-09-10 DIAGNOSIS — I10 Essential (primary) hypertension: Secondary | ICD-10-CM

## 2016-09-10 MED ORDER — DOXYCYCLINE HYCLATE 100 MG PO CAPS: 100.0000 mg | ORAL_CAPSULE | Freq: Two times a day (BID) | ORAL | 0 refills | Status: DC

## 2016-09-10 MED ORDER — TRAMADOL HCL 50 MG PO TABS: 50.0000 mg | ORAL_TABLET | Freq: Three times a day (TID) | ORAL | 0 refills | Status: DC | PRN

## 2016-09-10 MED ORDER — CEPHALEXIN 500 MG PO CAPS: 500.0000 mg | ORAL_CAPSULE | Freq: Two times a day (BID) | ORAL | 0 refills | Status: DC

## 2016-09-11 ENCOUNTER — Telehealth: Payer: Self-pay

## 2016-09-11 ENCOUNTER — Encounter: Payer: Self-pay | Admitting: Gastroenterology

## 2016-09-11 DIAGNOSIS — M79645 Pain in left finger(s): Secondary | ICD-10-CM | POA: Diagnosis not present

## 2016-09-11 DIAGNOSIS — I10 Essential (primary) hypertension: Secondary | ICD-10-CM | POA: Diagnosis not present

## 2016-09-11 DIAGNOSIS — L02512 Cutaneous abscess of left hand: Secondary | ICD-10-CM | POA: Diagnosis not present

## 2016-09-11 DIAGNOSIS — R2232 Localized swelling, mass and lump, left upper limb: Secondary | ICD-10-CM | POA: Diagnosis not present

## 2016-09-11 DIAGNOSIS — L03114 Cellulitis of left upper limb: Secondary | ICD-10-CM | POA: Diagnosis not present

## 2016-09-11 DIAGNOSIS — L03012 Cellulitis of left finger: Secondary | ICD-10-CM | POA: Diagnosis not present

## 2016-09-11 DIAGNOSIS — R600 Localized edema: Secondary | ICD-10-CM | POA: Diagnosis not present

## 2016-09-11 NOTE — Progress Notes (Signed)
Date:  09/10/2016   Name:  Randall Bell   DOB:  05-05-1956   MRN:  [id number]  PCP:  Adline Potter, MD    Chief Complaint: Cellulitis (Sulfa Abx usually do not help patient and finger is worse. ) and Ear Pain (Feels like he has inf in L and R and his jaw hurts. )   History of Present Illness:  This is a 61 y.o. male seen three days ago for cellulitis L index finger and placed on Bactrim. Reports finger more swollen and painful and now has slight anterior neck discomfort but no other systemic sxs. Pain significant now, ibuprofen not controlling.   Review of Systems:  Review of Systems  Constitutional: Negative for chills, diaphoresis, fatigue and fever.  HENT: Negative for trouble swallowing.   Respiratory: Negative for cough and shortness of breath.   Cardiovascular: Negative for chest pain and leg swelling.  Neurological: Negative for syncope and light-headedness.  Hematological: Negative for adenopathy.    Patient Active Problem List   Diagnosis Date Noted  . Obesity (BMI 30.0-34.9) 09/07/2016  . GERD (gastroesophageal reflux disease) 09/07/2016  . Vitamin D deficiency 08/02/2015  . Elevated liver enzymes 08/02/2015  . Hypertension 08/01/2015  . BPH (benign prostatic hypertrophy) 08/01/2015  . Genital warts 08/01/2015  . Paresthesia and pain of both upper extremities 08/01/2015  . Neck pain of over 3 months duration 08/01/2015  . FH: heart disease 08/01/2015  . FH: alpha 1 antitrypsin deficiency 08/01/2015  . Colon polyps 08/01/2015    Prior to Admission medications   Medication Sig Start Date End Date Taking? Authorizing Provider  Cholecalciferol (VITAMIN D3) 3000 units TABS Take 1 tablet by mouth daily. 09/07/16  Yes Adline Potter, MD  ibuprofen (ADVIL,MOTRIN) 200 MG tablet Take 400 mg by mouth every 6 (six) hours as needed.   Yes Historical Provider, MD  losartan (COZAAR) 100 MG tablet Take 1 tablet (100 mg total) by mouth daily. 09/07/16  Yes Adline Potter, MD   Multiple Vitamins-Minerals (CENTRUM SILVER ADULT 50+ PO) Take 1 tablet by mouth daily.   Yes Historical Provider, MD  vitamin C (ASCORBIC ACID) 500 MG tablet Take 500 mg by mouth daily.   Yes Historical Provider, MD  cephALEXin (KEFLEX) 500 MG capsule Take 1 capsule (500 mg total) by mouth 2 (two) times daily. 09/10/16   Adline Potter, MD  doxycycline (VIBRAMYCIN) 100 MG capsule Take 1 capsule (100 mg total) by mouth 2 (two) times daily. 09/10/16   Adline Potter, MD  traMADol (ULTRAM) 50 MG tablet Take 1 tablet (50 mg total) by mouth every 8 (eight) hours as needed for moderate pain. 09/10/16   Adline Potter, MD    Allergies  Allergen Reactions  . Flomax [Tamsulosin Hcl] Other (See Comments)    Sexual dysfunction  . Lisinopril Other (See Comments)    Dizziness and arthralgias  . Tamsulosin Other (See Comments)    Sexual dysfunction    Past Surgical History:  Procedure Laterality Date  . COLONOSCOPY  2011   repeat in 5 yrs/ polyps- [employer]  . HERNIA REPAIR      Social History  Substance Use Topics  . Smoking status: Former Smoker    Quit date: 10/10/2015  . Smokeless tobacco: Never Used  . Alcohol use 0.0 oz/week     Comment: social    Family History  Problem Relation Age of Onset  . Breast cancer Mother   . Heart disease Father     Medication list has been  reviewed and updated.  Physical Examination: BP 132/82   Pulse 87   Temp 97.9 F (36.6 C)   Resp 16   Ht 6\' 1"  (1.854 m)   Wt 248 lb (112.5 kg)   SpO2 98%   BMI 32.72 kg/m   Physical Exam  Constitutional: He appears well-developed and well-nourished.  Cardiovascular: Normal rate, regular rhythm and normal heart sounds.   Pulmonary/Chest: Effort normal and breath sounds normal.  Musculoskeletal:  L index finger with sl ulcerated lesion over dorsal proximal phalanx with worsened surrounding erythema and edema extending onto hand dorsum, no purulent drainage noted  Neurological: He is alert.  Skin: Skin is  warm and dry.  Psychiatric: He has a normal mood and affect. His behavior is normal.  Nursing note and vitals reviewed.   Assessment and Plan:  1. Cellulitis of left index finger Worsened on Bactrim, change to Keflex and doxycycline to broaden coverage, call tomorrow with progress, consider hospitalization for IV abxs if fails to improve, check XR to r/o FB or osteo - DG Finger Index Left; Future  2. Essential hypertension Well controlled on losartan  3. Vitamin D deficiency Well controlled on supplement  4. Elevated liver enzymes Stable overall, continue to moderate alcohol use  Return if symptoms worsen or fail to improve in 24 hours.  [name]. [facility]  09/11/2016

## 2016-09-11 NOTE — Telephone Encounter (Signed)
Patient is not feeling any better. Reports increased redness, pain, and swelling. He did take 3 doses of new Abx and Tramadol which has not helped at all. Advised to go to ER and he requested [facility]. I called them and gave update and they will see patient and may admit as we discussed for IV Abx at least 48 hours. Will call us with concerns.

## 2016-09-12 DIAGNOSIS — L03012 Cellulitis of left finger: Secondary | ICD-10-CM | POA: Diagnosis not present

## 2016-09-12 DIAGNOSIS — L02414 Cutaneous abscess of left upper limb: Secondary | ICD-10-CM | POA: Diagnosis not present

## 2016-09-12 DIAGNOSIS — Z8701 Personal history of pneumonia (recurrent): Secondary | ICD-10-CM | POA: Diagnosis not present

## 2016-09-12 DIAGNOSIS — L03114 Cellulitis of left upper limb: Secondary | ICD-10-CM | POA: Insufficient documentation

## 2016-09-12 DIAGNOSIS — Z79891 Long term (current) use of opiate analgesic: Secondary | ICD-10-CM | POA: Diagnosis not present

## 2016-09-12 DIAGNOSIS — Z6832 Body mass index (BMI) 32.0-32.9, adult: Secondary | ICD-10-CM | POA: Diagnosis not present

## 2016-09-12 DIAGNOSIS — M79645 Pain in left finger(s): Secondary | ICD-10-CM | POA: Diagnosis not present

## 2016-09-12 DIAGNOSIS — Z87891 Personal history of nicotine dependence: Secondary | ICD-10-CM | POA: Diagnosis not present

## 2016-09-12 DIAGNOSIS — I10 Essential (primary) hypertension: Secondary | ICD-10-CM | POA: Diagnosis not present

## 2016-09-12 DIAGNOSIS — R600 Localized edema: Secondary | ICD-10-CM | POA: Diagnosis not present

## 2016-09-12 DIAGNOSIS — L02512 Cutaneous abscess of left hand: Secondary | ICD-10-CM | POA: Diagnosis not present

## 2016-09-13 DIAGNOSIS — Z6832 Body mass index (BMI) 32.0-32.9, adult: Secondary | ICD-10-CM | POA: Diagnosis not present

## 2016-09-13 DIAGNOSIS — L03114 Cellulitis of left upper limb: Secondary | ICD-10-CM | POA: Diagnosis not present

## 2016-09-28 ENCOUNTER — Ambulatory Visit (INDEPENDENT_AMBULATORY_CARE_PROVIDER_SITE_OTHER): Payer: BLUE CROSS/BLUE SHIELD | Admitting: Family Medicine

## 2016-09-28 ENCOUNTER — Encounter: Payer: Self-pay | Admitting: Family Medicine

## 2016-09-28 VITALS — BP 138/82 | HR 88 | Temp 98.0°F | Resp 16 | Wt 225.0 lb

## 2016-09-28 DIAGNOSIS — L03012 Cellulitis of left finger: Secondary | ICD-10-CM

## 2016-09-28 DIAGNOSIS — I10 Essential (primary) hypertension: Secondary | ICD-10-CM | POA: Diagnosis not present

## 2016-09-28 MED ORDER — ERYTHROMYCIN BASE 500 MG PO TABS: 500.0000 mg | ORAL_TABLET | Freq: Three times a day (TID) | ORAL | 0 refills | Status: DC

## 2016-09-28 NOTE — Progress Notes (Signed)
Date:  09/28/2016   Name:  Randall Bell   DOB:  August 16, 1956   MRN:  [id number]  PCP:  Adline Potter, MD    Chief Complaint: finger swollen (still not better. )   History of Present Illness:  This is a 61 y.o. male seen in hospital f/u, admitted 09/11/16 for abscess L index finger, I+D performed in ED, cx grew MSSA, received daptomycin then PO doxy x 14d. Finger was improving but worse past 24 hrs, did some work outside yesterday which may have aggravated, still much better than when admitted. Was supposed to f/u with [facility] ortho clinic in 1-2 wks but appt never made. Truck driver, leaving on trip tomorrow, returns in one week.  Review of Systems:  Review of Systems  Constitutional: Negative for chills and fever.  Neurological: Negative for weakness and light-headedness.    Patient Active Problem List   Diagnosis Date Noted  . Obesity (BMI 30.0-34.9) 09/07/2016  . GERD (gastroesophageal reflux disease) 09/07/2016  . Vitamin D deficiency 08/02/2015  . Elevated liver enzymes 08/02/2015  . Hypertension 08/01/2015  . BPH (benign prostatic hypertrophy) 08/01/2015  . Genital warts 08/01/2015  . Paresthesia and pain of both upper extremities 08/01/2015  . Neck pain of over 3 months duration 08/01/2015  . FH: heart disease 08/01/2015  . FH: alpha 1 antitrypsin deficiency 08/01/2015  . Colon polyps 08/01/2015    Prior to Admission medications   Medication Sig Start Date End Date Taking? Authorizing Provider  Cholecalciferol (VITAMIN D3) 3000 units TABS Take 1 tablet by mouth daily. 09/07/16  Yes Adline Potter, MD  ibuprofen (ADVIL,MOTRIN) 200 MG tablet Take 400 mg by mouth every 6 (six) hours as needed.   Yes Historical Provider, MD  losartan (COZAAR) 100 MG tablet Take 1 tablet (100 mg total) by mouth daily. 09/07/16  Yes Adline Potter, MD  Multiple Vitamins-Minerals (CENTRUM SILVER ADULT 50+ PO) Take 1 tablet by mouth daily.   Yes Historical Provider, MD  traMADol (ULTRAM) 50 MG tablet  Take 1 tablet (50 mg total) by mouth every 8 (eight) hours as needed for moderate pain. 09/10/16  Yes Adline Potter, MD  vitamin C (ASCORBIC ACID) 500 MG tablet Take 500 mg by mouth daily.   Yes Historical Provider, MD  erythromycin base (E-MYCIN) 500 MG tablet Take 1 tablet (500 mg total) by mouth 3 (three) times daily. 09/28/16   Adline Potter, MD    Allergies  Allergen Reactions  . Flomax [Tamsulosin Hcl] Other (See Comments)    Sexual dysfunction  . Lisinopril Other (See Comments)    Dizziness and arthralgias  . Tamsulosin Other (See Comments)    Sexual dysfunction    Past Surgical History:  Procedure Laterality Date  . COLONOSCOPY  2011   repeat in 5 yrs/ polyps- [employer]  . HERNIA REPAIR      Social History  Substance Use Topics  . Smoking status: Former Smoker    Quit date: 10/10/2015  . Smokeless tobacco: Never Used  . Alcohol use 0.0 oz/week     Comment: social    Family History  Problem Relation Age of Onset  . Breast cancer Mother   . Heart disease Father     Medication list has been reviewed and updated.  Physical Examination: BP 138/82 (BP Location: Right Arm, Patient Position: Sitting, Cuff Size: Large)   Pulse 88   Temp 98 F (36.7 C)   Resp 16   Wt 225 lb (102.1 kg)   BMI 29.69 kg/m  Physical Exam  Musculoskeletal:  L index finger dorsum with healing wound over prox phalanx, still with significant erythema/edema but intact painless ROM and no discharge  Nursing note and vitals reviewed.   Assessment and Plan:  1. Cellulitis of left index finger Unclear if recurrent infection or residual erythema/edema, will cover with erythromycin, refer for ortho f/u, if sxs worsen may need further medical attention on trip, discussed with patient - Ambulatory referral to Orthopedic Surgery  2. Essential hypertension Well controlled on losartan  Return if symptoms worsen or fail to improve.  [name]. [facility]  09/28/2016

## 2016-09-28 NOTE — Patient Instructions (Signed)

## 2016-10-05 DIAGNOSIS — L03012 Cellulitis of left finger: Secondary | ICD-10-CM | POA: Diagnosis not present

## 2016-12-13 ENCOUNTER — Telehealth: Payer: Self-pay

## 2016-12-13 NOTE — Telephone Encounter (Signed)
Patient had labs done for work and all numbers are lower than last Lipid Panel and Liver Enzymes normal. He is coming back in May to discuss Liver Enzymes and amnt of antiinflammatory he takes.

## 2016-12-18 ENCOUNTER — Other Ambulatory Visit: Payer: Self-pay

## 2016-12-18 ENCOUNTER — Telehealth: Payer: Self-pay

## 2016-12-18 DIAGNOSIS — K219 Gastro-esophageal reflux disease without esophagitis: Secondary | ICD-10-CM

## 2016-12-18 DIAGNOSIS — Z1211 Encounter for screening for malignant neoplasm of colon: Secondary | ICD-10-CM

## 2016-12-18 NOTE — Telephone Encounter (Signed)
Gastroenterology Pre-Procedure Review  Request Date: 12/28/16 Requesting Physician: Dr. Allen Norris  PATIENT REVIEW QUESTIONS: The patient responded to the following health history questions as indicated:    1. Are you having any GI issues? yes (GERD) 2. Do you have a personal history of Polyps? yes (12 removed) 3. Do you have a family history of Colon Cancer or Polyps? no 4. Diabetes Mellitus? no 5. Joint replacements in the past 12 months?no 6. Major health problems in the past 3 months?yes (Staph Infection) 7. Any artificial heart valves, MVP, or defibrillator?no    MEDICATIONS & ALLERGIES:    Patient reports the following regarding taking any anticoagulation/antiplatelet therapy:   Plavix, Coumadin, Eliquis, Xarelto, Lovenox, Pradaxa, Brilinta, or Effient? no Aspirin? no  Patient confirms/reports the following medications:  Current Outpatient Prescriptions  Medication Sig Dispense Refill  . Cholecalciferol (VITAMIN D3) 3000 units TABS Take 1 tablet by mouth daily. 30 tablet   . erythromycin base (E-MYCIN) 500 MG tablet Take 1 tablet (500 mg total) by mouth 3 (three) times daily. 30 tablet 0  . ibuprofen (ADVIL,MOTRIN) 200 MG tablet Take 400 mg by mouth every 6 (six) hours as needed.    Marland Kitchen losartan (COZAAR) 100 MG tablet Take 1 tablet (100 mg total) by mouth daily. 90 tablet 3  . Multiple Vitamins-Minerals (CENTRUM SILVER ADULT 50+ PO) Take 1 tablet by mouth daily.    . traMADol (ULTRAM) 50 MG tablet Take 1 tablet (50 mg total) by mouth every 8 (eight) hours as needed for moderate pain. 15 tablet 0  . vitamin C (ASCORBIC ACID) 500 MG tablet Take 500 mg by mouth daily.     No current facility-administered medications for this visit.     Patient confirms/reports the following allergies:  Allergies  Allergen Reactions  . Flomax [Tamsulosin Hcl] Other (See Comments)    Sexual dysfunction  . Lisinopril Other (See Comments)    Dizziness and arthralgias  . Tamsulosin Other (See Comments)     Sexual dysfunction    No orders of the defined types were placed in this encounter.   AUTHORIZATION INFORMATION Primary Insurance: 1D#: Group #:  Secondary Insurance: 1D#: Group #:  SCHEDULE INFORMATION: Date: 5/11 Time: Location: [place]

## 2016-12-19 ENCOUNTER — Telehealth: Payer: Self-pay | Admitting: Gastroenterology

## 2016-12-19 NOTE — Telephone Encounter (Signed)
12/19/16 Per Terressa Koyanagi at [place] prior Josem Kaufmann is required for Colonoscopy & EGD 12197 / 43235 Z12.11 & K21.9

## 2016-12-24 ENCOUNTER — Encounter: Payer: Self-pay | Admitting: *Deleted

## 2016-12-25 ENCOUNTER — Telehealth: Payer: Self-pay | Admitting: Gastroenterology

## 2016-12-25 NOTE — Telephone Encounter (Signed)
Patient left a voice message that due to cost of the EGD he will only have the colonoscopy done Friday.

## 2016-12-25 NOTE — Telephone Encounter (Signed)
Left vm with [facility] to cancel EGD per pt request but keep colonoscopy.

## 2016-12-25 NOTE — Telephone Encounter (Signed)
Spoke with pt to confirm cancellation of EGD.

## 2016-12-27 DIAGNOSIS — L821 Other seborrheic keratosis: Secondary | ICD-10-CM | POA: Diagnosis not present

## 2016-12-27 DIAGNOSIS — Z86018 Personal history of other benign neoplasm: Secondary | ICD-10-CM | POA: Diagnosis not present

## 2016-12-27 DIAGNOSIS — L570 Actinic keratosis: Secondary | ICD-10-CM | POA: Diagnosis not present

## 2016-12-27 DIAGNOSIS — L720 Epidermal cyst: Secondary | ICD-10-CM | POA: Diagnosis not present

## 2016-12-27 DIAGNOSIS — D171 Benign lipomatous neoplasm of skin and subcutaneous tissue of trunk: Secondary | ICD-10-CM | POA: Diagnosis not present

## 2016-12-27 NOTE — Discharge Instructions (Signed)

## 2016-12-28 ENCOUNTER — Ambulatory Visit: Payer: BLUE CROSS/BLUE SHIELD | Admitting: Family Medicine

## 2016-12-28 ENCOUNTER — Ambulatory Visit: Payer: BLUE CROSS/BLUE SHIELD | Admitting: Anesthesiology

## 2016-12-28 ENCOUNTER — Ambulatory Visit
Admission: RE | Admit: 2016-12-28 | Discharge: 2016-12-28 | Disposition: A | Discharge status: Home / Self Care | Payer: BLUE CROSS/BLUE SHIELD | Source: Ambulatory Visit | Attending: Gastroenterology | Admitting: Gastroenterology

## 2016-12-28 ENCOUNTER — Encounter: Payer: Self-pay | Admitting: Gastroenterology

## 2016-12-28 ENCOUNTER — Encounter
Admission: RE | Disposition: A | Discharge status: Home / Self Care | Payer: Self-pay | Source: Ambulatory Visit | Attending: Gastroenterology

## 2016-12-28 DIAGNOSIS — M4692 Unspecified inflammatory spondylopathy, cervical region: Secondary | ICD-10-CM | POA: Insufficient documentation

## 2016-12-28 DIAGNOSIS — K640 First degree hemorrhoids: Secondary | ICD-10-CM | POA: Diagnosis not present

## 2016-12-28 DIAGNOSIS — M19042 Primary osteoarthritis, left hand: Secondary | ICD-10-CM | POA: Diagnosis not present

## 2016-12-28 DIAGNOSIS — D124 Benign neoplasm of descending colon: Secondary | ICD-10-CM

## 2016-12-28 DIAGNOSIS — K5730 Diverticulosis of large intestine without perforation or abscess without bleeding: Secondary | ICD-10-CM | POA: Insufficient documentation

## 2016-12-28 DIAGNOSIS — I10 Essential (primary) hypertension: Secondary | ICD-10-CM | POA: Diagnosis not present

## 2016-12-28 DIAGNOSIS — Z888 Allergy status to other drugs, medicaments and biological substances status: Secondary | ICD-10-CM | POA: Diagnosis not present

## 2016-12-28 DIAGNOSIS — Z809 Family history of malignant neoplasm, unspecified: Secondary | ICD-10-CM | POA: Diagnosis not present

## 2016-12-28 DIAGNOSIS — K219 Gastro-esophageal reflux disease without esophagitis: Secondary | ICD-10-CM | POA: Insufficient documentation

## 2016-12-28 DIAGNOSIS — D125 Benign neoplasm of sigmoid colon: Secondary | ICD-10-CM | POA: Diagnosis not present

## 2016-12-28 DIAGNOSIS — Z87891 Personal history of nicotine dependence: Secondary | ICD-10-CM | POA: Diagnosis not present

## 2016-12-28 DIAGNOSIS — K635 Polyp of colon: Secondary | ICD-10-CM | POA: Diagnosis not present

## 2016-12-28 DIAGNOSIS — M19041 Primary osteoarthritis, right hand: Secondary | ICD-10-CM | POA: Diagnosis not present

## 2016-12-28 DIAGNOSIS — Z79899 Other long term (current) drug therapy: Secondary | ICD-10-CM | POA: Diagnosis not present

## 2016-12-28 DIAGNOSIS — Z8249 Family history of ischemic heart disease and other diseases of the circulatory system: Secondary | ICD-10-CM | POA: Insufficient documentation

## 2016-12-28 DIAGNOSIS — Z860100 Personal history of colon polyps, unspecified: Secondary | ICD-10-CM

## 2016-12-28 DIAGNOSIS — Z803 Family history of malignant neoplasm of breast: Secondary | ICD-10-CM | POA: Insufficient documentation

## 2016-12-28 DIAGNOSIS — Z6832 Body mass index (BMI) 32.0-32.9, adult: Secondary | ICD-10-CM | POA: Insufficient documentation

## 2016-12-28 DIAGNOSIS — E669 Obesity, unspecified: Secondary | ICD-10-CM | POA: Insufficient documentation

## 2016-12-28 DIAGNOSIS — Z1211 Encounter for screening for malignant neoplasm of colon: Secondary | ICD-10-CM | POA: Insufficient documentation

## 2016-12-28 HISTORY — DX: Anesthesia of skin: R20.0

## 2016-12-28 HISTORY — DX: Gastro-esophageal reflux disease without esophagitis: K21.9

## 2016-12-28 HISTORY — DX: Unspecified osteoarthritis, unspecified site: M19.90

## 2016-12-28 HISTORY — PX: COLONOSCOPY WITH PROPOFOL: SHX5780

## 2016-12-28 HISTORY — DX: Presence of dental prosthetic device (complete) (partial): Z97.2

## 2016-12-28 HISTORY — PX: POLYPECTOMY: SHX5525

## 2016-12-28 SURGERY — COLONOSCOPY WITH PROPOFOL
Anesthesia: Monitor Anesthesia Care | Wound class: Contaminated

## 2016-12-28 MED ORDER — LACTATED RINGERS IV SOLN
1000.0000 mL | INTRAVENOUS | Status: DC
  Administered 2016-12-28: 1000 mL via INTRAVENOUS

## 2016-12-28 MED ORDER — STERILE WATER FOR IRRIGATION IR SOLN
Status: DC | PRN
  Administered 2016-12-28: 11:00:00

## 2016-12-28 MED ORDER — LIDOCAINE HCL (CARDIAC) 20 MG/ML IV SOLN
INTRAVENOUS | Status: DC | PRN
  Administered 2016-12-28: 50 mg via INTRAVENOUS

## 2016-12-28 MED ORDER — PROPOFOL 10 MG/ML IV BOLUS
INTRAVENOUS | Status: DC | PRN
  Administered 2016-12-28: 20 mg via INTRAVENOUS
  Administered 2016-12-28: 30 mg via INTRAVENOUS
  Administered 2016-12-28: 20 mg via INTRAVENOUS
  Administered 2016-12-28: 30 mg via INTRAVENOUS
  Administered 2016-12-28 (×3): 20 mg via INTRAVENOUS
  Administered 2016-12-28: 30 mg via INTRAVENOUS
  Administered 2016-12-28: 80 mg via INTRAVENOUS
  Administered 2016-12-28: 20 mg via INTRAVENOUS

## 2016-12-28 SURGICAL SUPPLY — 23 items
CANISTER SUCT 1200ML W/VALVE (MISCELLANEOUS) ×3 IMPLANT
CLIP HMST 235XBRD CATH ROT (MISCELLANEOUS) IMPLANT
CLIP RESOLUTION 360 11X235 (MISCELLANEOUS)
FCP ESCP3.2XJMB 240X2.8X (MISCELLANEOUS)
FORCEPS BIOP RAD 4 LRG CAP 4 (CUTTING FORCEPS) ×3 IMPLANT
FORCEPS BIOP RJ4 240 W/NDL (MISCELLANEOUS)
FORCEPS ESCP3.2XJMB 240X2.8X (MISCELLANEOUS) IMPLANT
GOWN CVR UNV OPN BCK APRN NK (MISCELLANEOUS) ×4 IMPLANT
GOWN ISOL THUMB LOOP REG UNIV (MISCELLANEOUS) ×2
INJECTOR VARIJECT VIN23 (MISCELLANEOUS) IMPLANT
KIT DEFENDO VALVE AND CONN (KITS) IMPLANT
KIT ENDO PROCEDURE OLY (KITS) ×3 IMPLANT
MARKER SPOT ENDO TATTOO 5ML (MISCELLANEOUS) IMPLANT
PAD GROUND ADULT SPLIT (MISCELLANEOUS) IMPLANT
PROBE APC STR FIRE (PROBE) IMPLANT
RETRIEVER NET ROTH 2.5X230 LF (MISCELLANEOUS) IMPLANT
SNARE SHORT THROW 13M SML OVAL (MISCELLANEOUS) ×3 IMPLANT
SNARE SHORT THROW 30M LRG OVAL (MISCELLANEOUS) IMPLANT
SNARE SNG USE RND 15MM (INSTRUMENTS) IMPLANT
SPOT EX ENDOSCOPIC TATTOO (MISCELLANEOUS)
TRAP ETRAP POLY (MISCELLANEOUS) ×3 IMPLANT
VARIJECT INJECTOR VIN23 (MISCELLANEOUS)
WATER STERILE IRR 250ML POUR (IV SOLUTION) ×3 IMPLANT

## 2016-12-28 NOTE — Transfer of Care (Signed)
Immediate Anesthesia Transfer of Care Note  Patient: Randall Bell  Procedure(s) Performed: Procedure(s): COLONOSCOPY WITH PROPOFOL (N/A) POLYPECTOMY  Patient Location: PACU  Anesthesia Type: MAC  Level of Consciousness: awake, alert  and patient cooperative  Airway and Oxygen Therapy: Patient Spontanous Breathing and Patient connected to supplemental oxygen  Post-op Assessment: Post-op Vital signs reviewed, Patient's Cardiovascular Status Stable, Respiratory Function Stable, Patent Airway and No signs of Nausea or vomiting  Post-op Vital Signs: Reviewed and stable  Complications: No apparent anesthesia complications

## 2016-12-28 NOTE — Anesthesia Preprocedure Evaluation (Addendum)
Anesthesia Evaluation  Patient identified by MRN, date of birth, ID band Patient awake    Reviewed: Allergy & Precautions, NPO status , Patient's Chart, lab work & pertinent test results, reviewed documented beta blocker date and time   Airway Mallampati: II  TM Distance: >3 FB Neck ROM: Full    Dental  (+) Partial Upper   Pulmonary former smoker,    Pulmonary exam normal breath sounds clear to auscultation       Cardiovascular hypertension, Normal cardiovascular exam Rhythm:Regular Rate:Normal     Neuro/Psych negative neurological ROS  negative psych ROS   GI/Hepatic Neg liver ROS, GERD  ,  Endo/Other  negative endocrine ROS  Renal/GU negative Renal ROS   Prostate hypertrophy    Musculoskeletal  (+) Arthritis ,   Abdominal (+) + obese,   Peds  Hematology negative hematology ROS (+)   Anesthesia Other Findings   Reproductive/Obstetrics                            Anesthesia Physical Anesthesia Plan  ASA: II  Anesthesia Plan: MAC   Post-op Pain Management:    Induction:   Airway Management Planned: Mask  Additional Equipment: None  Intra-op Plan:   Post-operative Plan:   Informed Consent: I have reviewed the patients History and Physical, chart, labs and discussed the procedure including the risks, benefits and alternatives for the proposed anesthesia with the patient or authorized representative who has indicated his/her understanding and acceptance.     Plan Discussed with: CRNA, Anesthesiologist and Surgeon  Anesthesia Plan Comments:         Anesthesia Quick Evaluation

## 2016-12-28 NOTE — Anesthesia Procedure Notes (Signed)
Procedure Name: MAC Performed by: Mayme Genta Pre-anesthesia Checklist: Patient identified, Emergency Drugs available, Suction available, Timeout performed and Patient being monitored Patient Re-evaluated:Patient Re-evaluated prior to inductionOxygen Delivery Method: Nasal cannula Placement Confirmation: positive ETCO2

## 2016-12-28 NOTE — Anesthesia Postprocedure Evaluation (Signed)
Anesthesia Post Note  Patient: Randall Bell  Procedure(s) Performed: Procedure(s) (LRB): COLONOSCOPY WITH PROPOFOL (N/A) POLYPECTOMY  Patient location during evaluation: PACU Anesthesia Type: MAC Level of consciousness: awake Pain management: pain level controlled Vital Signs Assessment: post-procedure vital signs reviewed and stable Respiratory status: spontaneous breathing Cardiovascular status: blood pressure returned to baseline Postop Assessment: no headache Anesthetic complications: no    Lavonna Monarch

## 2016-12-28 NOTE — Op Note (Signed)
[facility] Gastroenterology Patient Name: Randall Bell Procedure Date: 12/28/2016 10:52 AM MRN: [id number] Account #: [id number] Date of Birth: 1956/07/25 Admit Type: Outpatient Age: 61 Room: [facility] OR ROOM 01 Gender: Male Note Status: Finalized Procedure:            Colonoscopy Indications:          High risk colon cancer surveillance: Personal history                        of colonic polyps Providers:            Lucilla Lame MD, MD Referring MD:         [name]. Plonk, MD (Referring MD) Medicines:            Propofol per Anesthesia Complications:        No immediate complications. Procedure:            Pre-Anesthesia Assessment:                       - Prior to the procedure, a History and Physical was                        performed, and patient medications and allergies were                        reviewed. The patient's tolerance of previous                        anesthesia was also reviewed. The risks and benefits of                        the procedure and the sedation options and risks were                        discussed with the patient. All questions were                        answered, and informed consent was obtained. Prior                        Anticoagulants: The patient has taken no previous                        anticoagulant or antiplatelet agents. ASA Grade                        Assessment: II - A patient with mild systemic disease.                        After reviewing the risks and benefits, the patient was                        deemed in satisfactory condition to undergo the                        procedure.                       After obtaining informed consent, the colonoscope was  passed under direct vision. Throughout the procedure,                        the patient's blood pressure, pulse, and oxygen                        saturations were monitored continuously. The Olympus   Colonoscope [id number]) was introduced through the                        anus and advanced to the the cecum, identified by                        appendiceal orifice and ileocecal valve. The                        colonoscopy was performed without difficulty. The                        patient tolerated the procedure well. The quality of                        the bowel preparation was excellent. Findings:      The perianal and digital rectal examinations were normal.      Two sessile polyps were found in the descending colon. The polyps were 3       to 4 mm in size. These polyps were removed with a cold biopsy forceps.       Resection and retrieval were complete.      Six sessile polyps were found in the sigmoid colon. The polyps were 3 to       4 mm in size. These polyps were removed with a cold biopsy forceps.       Resection and retrieval were complete.      Non-bleeding internal hemorrhoids were found during retroflexion. The       hemorrhoids were Grade I (internal hemorrhoids that do not prolapse).      Multiple small-mouthed diverticula were found in the sigmoid colon. Impression:           - Two 3 to 4 mm polyps in the descending colon, removed                        with a cold biopsy forceps. Resected and retrieved.                       - Six 3 to 4 mm polyps in the sigmoid colon, removed                        with a cold biopsy forceps. Resected and retrieved.                       - Non-bleeding internal hemorrhoids.                       - Diverticulosis in the sigmoid colon. Recommendation:       - Discharge patient to home.                       - Resume previous diet.                       -  Continue present medications.                       - Await pathology results.                       - Repeat colonoscopy in 5 years for surveillance. Procedure Code(s):    --- Professional ---                       [phone], Colonoscopy, flexible; with biopsy, single or                         multiple Diagnosis Code(s):    --- Professional ---                       Z86.010, Personal history of colonic polyps                       D12.4, Benign neoplasm of descending colon                       D12.5, Benign neoplasm of sigmoid colon CPT copyright 2016 American Medical Association. All rights reserved. The codes documented in this report are preliminary and upon coder review may  be revised to meet current compliance requirements. Lucilla Lame MD, MD 12/28/2016 11:16:40 AM This report has been signed electronically. Number of Addenda: 0 Note Initiated On: 12/28/2016 10:52 AM Scope Withdrawal Time: 0 hours 10 minutes 40 seconds  Total Procedure Duration: 0 hours 12 minutes 53 seconds       [facility]

## 2016-12-28 NOTE — H&P (Signed)
Lucilla Lame, MD [address], [place] [address] Phone:[phone] Fax : [phone]  Primary Care Physician:  Adline Potter, MD Primary Gastroenterologist:  Dr. Allen Norris  Pre-Procedure History & Physical: HPI:  Randall Bell is a 61 y.o. male is here for an colonoscopy.   Past Medical History:  Diagnosis Date  . Arthritis    neck, hands  . Bilateral hand numbness    pt thinks it may be Carpal Tunnel  . GERD (gastroesophageal reflux disease)   . Hypertension   . Wears dentures    partial upper    Past Surgical History:  Procedure Laterality Date  . COLONOSCOPY  2011   repeat in 5 yrs/ polyps- [employer]  . HERNIA REPAIR      Prior to Admission medications   Medication Sig Start Date End Date Taking? Authorizing Provider  Cholecalciferol (VITAMIN D3) 3000 units TABS Take 1 tablet by mouth daily. 09/07/16  Yes Plonk, Gwyndolyn Saxon, MD  Garlic 1751 MG CAPS Take by mouth daily.   Yes [provider]  ibuprofen (ADVIL,MOTRIN) 200 MG tablet Take 400 mg by mouth every 6 (six) hours as needed.   Yes [provider]  losartan (COZAAR) 100 MG tablet Take 1 tablet (100 mg total) by mouth daily. 09/07/16  Yes Plonk, Gwyndolyn Saxon, MD  Multiple Vitamins-Minerals (CENTRUM SILVER ADULT 50+ PO) Take 1 tablet by mouth daily.   Yes [provider]  Omega-3 Fatty Acids (OMEGA 3 PO) Take by mouth daily.   Yes [provider]  OVER THE COUNTER MEDICATION daily. Beet root   Yes [provider]  OVER THE COUNTER MEDICATION daily. Dr. Ardeth Perfect vital reds   Yes [provider]  vitamin C (ASCORBIC ACID) 500 MG tablet Take 500 mg by mouth daily.   Yes [provider]    Allergies as of 12/18/2016 - Review Complete 09/28/2016  Allergen Reaction Noted  . Lisinopril Other (See Comments) 08/01/2015  . Flomax [tamsulosin hcl] Other (See Comments) 08/01/2015  . Tamsulosin Other (See Comments) 08/01/2015    Family History    Problem Relation Age of Onset  . Breast cancer Mother   . Heart disease Father     Social History   Social History  . Marital status: Single    Spouse name: N/A  . Number of children: N/A  . Years of education: N/A   Occupational History  . Not on file.   Social History Main Topics  . Smoking status: Former Smoker    Types: Cigarettes    Quit date: 08/20/2014  . Smokeless tobacco: Never Used  . Alcohol use 7.2 oz/week    12 Cans of beer per week     Comment:    . Drug use: No  . Sexual activity: Not on file   Other Topics Concern  . Not on file   Social History Narrative  . No narrative on file    Review of Systems: See HPI, otherwise negative ROS  Physical Exam: BP 129/84   Pulse 83   Temp 97.2 F (36.2 C) (Temporal)   Resp 16   Ht 6\' 1"  (1.854 m)   Wt 248 lb (112.5 kg)   SpO2 97%   BMI 32.72 kg/m  General:   Alert,  pleasant and cooperative in NAD Head:  Normocephalic and atraumatic. Neck:  Supple; no masses or thyromegaly. Lungs:  Clear throughout to auscultation.    Heart:  Regular rate and rhythm. Abdomen:  Soft, nontender and  nondistended. Normal bowel sounds, without guarding, and without rebound.   Neurologic:  Alert and  oriented x4;  grossly normal neurologically.  Impression/Plan: Randall Bell is here for an colonoscopy to be performed for history of polyps  Risks, benefits, limitations, and alternatives regarding  colonoscopy have been reviewed with the patient.  Questions have been answered.  All parties agreeable.   Lucilla Lame, MD  12/28/2016, 10:19 AM

## 2017-01-01 ENCOUNTER — Encounter: Payer: Self-pay | Admitting: Gastroenterology

## 2017-01-11 ENCOUNTER — Ambulatory Visit: Payer: BLUE CROSS/BLUE SHIELD | Admitting: Urology

## 2017-02-11 ENCOUNTER — Ambulatory Visit: Payer: BLUE CROSS/BLUE SHIELD | Admitting: Urology

## 2017-02-18 ENCOUNTER — Ambulatory Visit: Payer: BLUE CROSS/BLUE SHIELD | Admitting: Family Medicine

## 2017-02-18 ENCOUNTER — Telehealth: Payer: Self-pay

## 2017-02-18 NOTE — Telephone Encounter (Signed)
Agree with need to see as not treated for prostate infection here in past. Likely dx'd with diverticulosis, not diverticulitis, at colonoscopy, for which there is no specific treatment.

## 2017-02-18 NOTE — Telephone Encounter (Signed)
Patient having prostate issue and has appt at urology in a month. Wanted Abx called in. Advised we must see him in office.  He is calling Urology to see if they can see him sooner and will call back if not. He also said he was dx with diverticulitis when he got colonoscopy but never treated for it. He will contact GI about that or call us back form appt as well.

## 2017-02-19 ENCOUNTER — Ambulatory Visit: Payer: BLUE CROSS/BLUE SHIELD

## 2017-03-10 NOTE — Progress Notes (Signed)
03/11/2017 12:21 PM   Randall Bell Oct 29, 1955 [id number]  Referring provider: Adline Potter, MD [address] [place], [place] [address]  Chief Complaint  Patient presents with  . New Patient (Initial Visit)    enlarged prostate referred by  Dr. Vicente Masson    HPI: Patient is a 61 year old Caucasian male who is referred by his primary care physician, Dr. Adline Potter, for an enlarged prostate.  BPH WITH LUTS  (prostate and/or bladder) His IPSS score today is 29, which is severe lower urinary tract symptomatology. He is mixed with his quality life due to his urinary symptoms. His PVR is 90 mL.    His major complaints today are frequency x 5-6, urgency, nocturia x 5, incontinence, intermittency, hesitancy, weak urinary stream and straining to urinate..  He has had these symptoms for several years.  He denies any dysuria, hematuria or suprapubic pain.   He also denies any recent fevers, chills, nausea or vomiting.  His father had prostate cancer.        IPSS    Row Name 03/11/17 1100         International Prostate Symptom Score   How often have you had the sensation of not emptying your bladder? Almost always     How often have you had to urinate less than every two hours? More than half the time     How often have you found you stopped and started again several times when you urinated? Almost always     How often have you found it difficult to postpone urination? About half the time     How often have you had a weak urinary stream? Almost always     How often have you had to strain to start urination? About half the time     How many times did you typically get up at night to urinate? 4 Times     Total IPSS Score 29       Quality of Life due to urinary symptoms   If you were to spend the rest of your life with your urinary condition just the way it is now how would you feel about that? Mixed        Score:  1-7 Mild 8-19 Moderate 20-35 Severe  He  states that the area in his right epididymis has been enlarging over the last two years.  He states there is no pain.     PMH: Past Medical History:  Diagnosis Date  . Arthritis    neck, hands  . Bilateral hand numbness    pt thinks it may be Carpal Tunnel  . GERD (gastroesophageal reflux disease)   . Hypertension   . Wears dentures    partial upper    Surgical History: Past Surgical History:  Procedure Laterality Date  . COLONOSCOPY  2011   repeat in 5 yrs/ polyps- [employer]  . COLONOSCOPY WITH PROPOFOL N/A 12/28/2016   Procedure: COLONOSCOPY WITH PROPOFOL;  Surgeon: Lucilla Lame, MD;  Location: [facility];  Service: Endoscopy;  Laterality: N/A;  . HERNIA REPAIR    . POLYPECTOMY  12/28/2016   Procedure: POLYPECTOMY;  Surgeon: Lucilla Lame, MD;  Location: [place] [facility];  Service: Endoscopy;;    Home Medications:  Allergies as of 03/11/2017      Reactions   Lisinopril Other (See Comments)   Dizziness and arthralgias   Flomax [tamsulosin Hcl] Other (See Comments)   Sexual dysfunction   Tamsulosin Other (See Comments)  Sexual dysfunction      Medication List       Accurate as of 03/11/17 12:21 PM. Always use your most recent med list.          alfuzosin 10 MG 24 hr tablet Commonly known as:  UROXATRAL Take 1 tablet (10 mg total) by mouth daily with breakfast.   aspirin EC 81 MG tablet Take by mouth.   CENTRUM SILVER ADULT 50+ PO Take 1 tablet by mouth daily.   finasteride 5 MG tablet Commonly known as:  PROSCAR Take 1 tablet (5 mg total) by mouth daily.   Garlic 4097 MG Caps Take by mouth daily.   ibuprofen 200 MG tablet Commonly known as:  ADVIL,MOTRIN Take 400 mg by mouth every 6 (six) hours as needed.   losartan 100 MG tablet Commonly known as:  COZAAR Take 1 tablet (100 mg total) by mouth daily.   OMEGA 3 PO Take by mouth daily.   OVER THE COUNTER MEDICATION daily. Beet root   OVER THE COUNTER MEDICATION daily. Dr. Ardeth Perfect  vital reds   vitamin C 500 MG tablet Commonly known as:  ASCORBIC ACID Take 500 mg by mouth daily.   Vitamin D3 3000 units Tabs Take 1 tablet by mouth daily.       Allergies:  Allergies  Allergen Reactions  . Lisinopril Other (See Comments)    Dizziness and arthralgias  . Flomax [Tamsulosin Hcl] Other (See Comments)    Sexual dysfunction  . Tamsulosin Other (See Comments)    Sexual dysfunction    Family History: Family History  Problem Relation Age of Onset  . Breast cancer Mother   . Heart disease Father   . Prostate cancer Father   . Kidney cancer Neg Hx   . Bladder Cancer Neg Hx     Social History:  reports that he quit smoking about 2 years ago. His smoking use included Cigarettes. He has never used smokeless tobacco. He reports that he drinks about 7.2 oz of alcohol per week . He reports that he does not use drugs.  ROS: UROLOGY Frequent Urination?: Yes Hard to postpone urination?: Yes Burning/pain with urination?: No Get up at night to urinate?: Yes Leakage of urine?: Yes Urine stream starts and stops?: Yes Trouble starting stream?: Yes Do you have to strain to urinate?: Yes Blood in urine?: No Urinary tract infection?: No Sexually transmitted disease?: No Injury to kidneys or bladder?: No Painful intercourse?: No Weak stream?: Yes Erection problems?: No Penile pain?: No  Gastrointestinal Nausea?: Yes Vomiting?: No Indigestion/heartburn?: Yes Diarrhea?: No Constipation?: No  Constitutional Fever: Yes Night sweats?: No Weight loss?: No Fatigue?: Yes  Skin Skin rash/lesions?: No Itching?: No  Eyes Blurred vision?: Yes Double vision?: No  Ears/Nose/Throat Sore throat?: No Sinus problems?: Yes  Hematologic/Lymphatic Swollen glands?: No Easy bruising?: Yes  Cardiovascular Leg swelling?: Yes Chest pain?: No  Respiratory Cough?: No Shortness of breath?: No  Endocrine Excessive thirst?: No  Musculoskeletal Back pain?:  Yes Joint pain?: Yes  Neurological Headaches?: No Dizziness?: No  Psychologic Depression?: No Anxiety?: No  Physical Exam: BP (!) 148/89   Pulse 83   Ht 6\' 1"  (1.854 m)   Wt 245 lb 14.4 oz (111.5 kg)   BMI 32.44 kg/m   Constitutional: Well nourished. Alert and oriented, No acute distress. HEENT: [place] AT, moist mucus membranes. Trachea midline, no masses. Cardiovascular: No clubbing, cyanosis, or edema. Respiratory: Normal respiratory effort, no increased work of breathing. GI: Abdomen is soft, non tender,  non distended, no abdominal masses. Liver and spleen not palpable.  No hernias appreciated.  Stool sample for occult testing is not indicated.   GU: No CVA tenderness.  No bladder fullness or masses.  Patient with circumcised phallus.   Urethral meatus is patent.  No penile discharge. No penile lesions or rashes. Scrotum without lesions, cysts, rashes and/or edema.  Testicles are located scrotally bilaterally. No masses are appreciated in the testicles. Right epididymis is normal.  Left epidiymis with possible spermatocele.   Rectal: Patient with  normal sphincter tone. Anus and perineum without scarring or rashes. No rectal masses are appreciated. Prostate is approximately 60 grams, no nodules are appreciated. Seminal vesicles are normal. Skin: No rashes, bruises or suspicious lesions. Lymph: No cervical or inguinal adenopathy. Neurologic: Grossly intact, no focal deficits, moving all 4 extremities. Psychiatric: Normal mood and affect.  Laboratory Data: Lab Results  Component Value Date   WBC 4.7 04/09/2016   HGB 15.7 04/09/2016   HCT 44.5 04/09/2016   MCV 90.8 04/09/2016   PLT 179 04/09/2016    Lab Results  Component Value Date   CREATININE 1.14 09/07/2016    Lab Results  Component Value Date   AST 40 09/07/2016   Lab Results  Component Value Date   ALT 56 (H) 09/07/2016    I have independently reviewed the labs.   Pertinent Imaging: Results for IVOR, KISHI (MRN [id number]) as of 03/11/2017 12:21  Ref. Range 03/11/2017 11:46  Scan Result Unknown 90   I have independently reviewed the films.    Assessment & Plan:    1. BPH with LUTS  - IPSS score is 29/3  - Continue conservative management, avoiding bladder irritants and timed voiding's  - most bothersome symptoms is/are frequency and nocturia  - Initiate alpha-blocker (alfuzosin 10 mg), discussed side effects   - Initiate 5 alpha reductase inhibitor (finasteride 5 mg), discussed side effects   - RTC in one month for IPSS, PSA and PVR   2. Nocturia  - patient will sleep apnea and sleeping with CPAP machine  - will reassess when he RTC in one month  3. Epididymal mass  - enlarging per patient  - schedule scrotal ultrasound  Return in about 1 month (around 04/11/2017) for IPSS, PSA and PVR.  These notes generated with voice recognition software. I apologize for typographical errors.  Zara Council, [facility] [address] [place], [place] [address] [phone]

## 2017-03-11 ENCOUNTER — Ambulatory Visit (INDEPENDENT_AMBULATORY_CARE_PROVIDER_SITE_OTHER): Payer: BLUE CROSS/BLUE SHIELD | Admitting: Urology

## 2017-03-11 ENCOUNTER — Encounter: Payer: Self-pay | Admitting: Family Medicine

## 2017-03-11 ENCOUNTER — Ambulatory Visit: Payer: BLUE CROSS/BLUE SHIELD | Admitting: Urology

## 2017-03-11 ENCOUNTER — Encounter: Payer: Self-pay | Admitting: Urology

## 2017-03-11 VITALS — BP 148/89 | HR 83 | Ht 73.0 in | Wt 245.9 lb

## 2017-03-11 DIAGNOSIS — N509 Disorder of male genital organs, unspecified: Secondary | ICD-10-CM

## 2017-03-11 DIAGNOSIS — N138 Other obstructive and reflux uropathy: Secondary | ICD-10-CM | POA: Diagnosis not present

## 2017-03-11 DIAGNOSIS — G4730 Sleep apnea, unspecified: Secondary | ICD-10-CM | POA: Insufficient documentation

## 2017-03-11 DIAGNOSIS — R351 Nocturia: Secondary | ICD-10-CM | POA: Diagnosis not present

## 2017-03-11 DIAGNOSIS — N401 Enlarged prostate with lower urinary tract symptoms: Secondary | ICD-10-CM | POA: Diagnosis not present

## 2017-03-11 DIAGNOSIS — N5089 Other specified disorders of the male genital organs: Secondary | ICD-10-CM

## 2017-03-11 LAB — BLADDER SCAN AMB NON-IMAGING: Scan Result: 90

## 2017-03-11 MED ORDER — ALFUZOSIN HCL ER 10 MG PO TB24: 10.0000 mg | ORAL_TABLET | Freq: Every day | ORAL | 3 refills | Status: DC

## 2017-03-11 MED ORDER — FINASTERIDE 5 MG PO TABS: 5.0000 mg | ORAL_TABLET | Freq: Every day | ORAL | 3 refills | Status: DC

## 2017-03-12 ENCOUNTER — Ambulatory Visit (INDEPENDENT_AMBULATORY_CARE_PROVIDER_SITE_OTHER): Payer: BLUE CROSS/BLUE SHIELD | Admitting: Family Medicine

## 2017-03-12 ENCOUNTER — Encounter: Payer: Self-pay | Admitting: Family Medicine

## 2017-03-12 VITALS — BP 122/84 | HR 86 | Temp 99.1°F | Resp 16 | Ht 73.0 in | Wt 241.0 lb

## 2017-03-12 DIAGNOSIS — I10 Essential (primary) hypertension: Secondary | ICD-10-CM | POA: Diagnosis not present

## 2017-03-12 DIAGNOSIS — K5792 Diverticulitis of intestine, part unspecified, without perforation or abscess without bleeding: Secondary | ICD-10-CM

## 2017-03-12 DIAGNOSIS — N401 Enlarged prostate with lower urinary tract symptoms: Secondary | ICD-10-CM

## 2017-03-12 DIAGNOSIS — R1032 Left lower quadrant pain: Secondary | ICD-10-CM

## 2017-03-12 DIAGNOSIS — E669 Obesity, unspecified: Secondary | ICD-10-CM

## 2017-03-12 DIAGNOSIS — R350 Frequency of micturition: Secondary | ICD-10-CM

## 2017-03-12 LAB — POCT URINALYSIS DIPSTICK
BILIRUBIN UA: NEGATIVE
Blood, UA: NEGATIVE
Glucose, UA: NEGATIVE
KETONES UA: NEGATIVE
LEUKOCYTES UA: NEGATIVE
Nitrite, UA: NEGATIVE
Protein, UA: NEGATIVE
SPEC GRAV UA: 1.02 (ref 1.010–1.025)
Urobilinogen, UA: 0.2 E.U./dL
pH, UA: 6 (ref 5.0–8.0)

## 2017-03-12 MED ORDER — METRONIDAZOLE 500 MG PO TABS: 500.0000 mg | ORAL_TABLET | Freq: Two times a day (BID) | ORAL | 0 refills | Status: DC

## 2017-03-12 MED ORDER — SULFAMETHOXAZOLE-TRIMETHOPRIM 800-160 MG PO TABS: 1.0000 | ORAL_TABLET | Freq: Two times a day (BID) | ORAL | 0 refills | Status: DC

## 2017-03-12 MED ORDER — ACETAMINOPHEN 500 MG PO TABS: 1000.0000 mg | ORAL_TABLET | Freq: Three times a day (TID) | ORAL | 0 refills | Status: AC | PRN

## 2017-03-12 NOTE — Patient Instructions (Addendum)
Recommend stop all supplements except multivitamin and vitamin D.

## 2017-03-12 NOTE — Progress Notes (Signed)
Date:  03/12/2017   Name:  Randall Bell   DOB:  1955/11/27   MRN:  [id number]  PCP:  Adline Potter, MD    Chief Complaint: Fever (Second night time fever over 100 since colonoscopy. Enlarged prostate and Diverticulosis also. Seen Urology yesterday. )   History of Present Illness:  This is a 61 y.o. male seen for LLQ pain and fever last PM, diverticulosis on colonoscopy in past, had similar episode 3 weeks which resolved with Cipro. Saw urology for BPH yesterday, Proscar and Uroxatral started. On losartan for HTN and vit D, multiple other supplements.   Review of Systems:  Review of Systems  HENT: Negative for ear pain, sinus pain and sore throat.   Respiratory: Negative for cough and shortness of breath.   Cardiovascular: Negative for chest pain and leg swelling.  Gastrointestinal: Negative for constipation, diarrhea, nausea and vomiting.  Genitourinary: Negative for dysuria.  Neurological: Negative for syncope and light-headedness.    Patient Active Problem List   Diagnosis Date Noted  . Sleep apnea 03/11/2017  . Personal history of colonic polyps   . Benign neoplasm of descending colon   . Cellulitis of left hand 09/12/2016  . Obesity (BMI 30.0-34.9) 09/07/2016  . GERD (gastroesophageal reflux disease) 09/07/2016  . Vitamin D deficiency 08/02/2015  . Elevated liver enzymes 08/02/2015  . Hypertension 08/01/2015  . Benign prostatic hyperplasia 08/01/2015  . Genital warts 08/01/2015  . Paresthesia and pain of both upper extremities 08/01/2015  . Neck pain of over 3 months duration 08/01/2015  . FH: heart disease 08/01/2015  . FH: alpha 1 antitrypsin deficiency 08/01/2015  . Colon polyps 08/01/2015    Prior to Admission medications   Medication Sig Start Date End Date Taking? Authorizing Provider  alfuzosin (UROXATRAL) 10 MG 24 hr tablet Take 1 tablet (10 mg total) by mouth daily with breakfast. 03/11/17  Yes McGowan, Larene Beach A, PA-C  Cholecalciferol (VITAMIN D3) 3000  units TABS Take 1 tablet by mouth daily. 09/07/16  Yes Rileigh Kawashima, Gwyndolyn Saxon, MD  finasteride (PROSCAR) 5 MG tablet Take 1 tablet (5 mg total) by mouth daily. 03/11/17  Yes McGowan, Larene Beach A, PA-C  losartan (COZAAR) 100 MG tablet Take 1 tablet (100 mg total) by mouth daily. 09/07/16  Yes Jalayia Bagheri, Gwyndolyn Saxon, MD  Multiple Vitamins-Minerals (CENTRUM SILVER ADULT 50+ PO) Take 1 tablet by mouth daily.   Yes [provider]  acetaminophen (TYLENOL) 500 MG tablet Take 2 tablets (1,000 mg total) by mouth every 8 (eight) hours as needed. 03/12/17   Simren Popson, Gwyndolyn Saxon, MD  metroNIDAZOLE (FLAGYL) 500 MG tablet Take 1 tablet (500 mg total) by mouth 2 (two) times daily. 03/12/17   Vernita Tague, Gwyndolyn Saxon, MD  sulfamethoxazole-trimethoprim (BACTRIM DS,SEPTRA DS) 800-160 MG tablet Take 1 tablet by mouth 2 (two) times daily. 03/12/17   Adline Potter, MD    Allergies  Allergen Reactions  . Lisinopril Other (See Comments)    Dizziness and arthralgias  . Flomax [Tamsulosin Hcl] Other (See Comments)    Sexual dysfunction  . Tamsulosin Other (See Comments)    Sexual dysfunction    Past Surgical History:  Procedure Laterality Date  . COLONOSCOPY  2011   repeat in 5 yrs/ polyps- [employer]  . COLONOSCOPY WITH PROPOFOL N/A 12/28/2016   Procedure: COLONOSCOPY WITH PROPOFOL;  Surgeon: Lucilla Lame, MD;  Location: [facility];  Service: Endoscopy;  Laterality: N/A;  . HERNIA REPAIR    . POLYPECTOMY  12/28/2016   Procedure: POLYPECTOMY;  Surgeon: Lucilla Lame, MD;  Location: [facility];  Service: Endoscopy;;    Social History  Substance Use Topics  . Smoking status: Former Smoker    Types: Cigarettes    Quit date: 08/20/2014  . Smokeless tobacco: Never Used  . Alcohol use 7.2 oz/week    12 Cans of beer per week     Comment:      Family History  Problem Relation Age of Onset  . Breast cancer Mother   . Heart disease Father   . Prostate cancer Father   . Kidney cancer Neg Hx   . Bladder Cancer Neg Hx      Medication list has been reviewed and updated.  Physical Examination: BP 122/84   Pulse 86   Temp 99.1 F (37.3 C) (Oral)   Resp 16   Ht 6\' 1"  (1.854 m)   Wt 241 lb (109.3 kg)   SpO2 96%   BMI 31.80 kg/m   Physical Exam  Constitutional: He appears well-developed and well-nourished.  Cardiovascular: Normal rate, regular rhythm and normal heart sounds.   Pulmonary/Chest: Effort normal and breath sounds normal.  Abdominal: Soft. He exhibits no distension and no mass. There is no rebound and no guarding.  Mod LLQ tenderness  Musculoskeletal: He exhibits no edema.  Neurological: He is alert.  Skin: Skin is warm and dry.  Psychiatric: He has a normal mood and affect. His behavior is normal.  Nursing note and vitals reviewed.   Assessment and Plan:  1. Left lower quadrant pain UA concentrated but negative, encourage PO fluids - POCT Urinalysis Dipstick  2. Diverticulitis Bactrim DS/Flagyl bid x 7d, call if sxs worsen/persist  3. Essential hypertension Well controlled on losartan  4. Benign prostatic hyperplasia with urinary frequency On Proscar/Uroxatral since yesterday per urology  5. Obesity (BMI 30.0-34.9) Weight up 15#, exercise/weight loss discussed  Return in about 4 weeks (around 04/09/2017).  [name]. [facility]  03/12/2017

## 2017-03-13 ENCOUNTER — Ambulatory Visit
Admission: EM | Admit: 2017-03-13 | Discharge: 2017-03-13 | Disposition: A | Discharge status: Home / Self Care | Payer: BLUE CROSS/BLUE SHIELD | Attending: Family Medicine | Admitting: Family Medicine

## 2017-03-13 ENCOUNTER — Encounter: Payer: Self-pay | Admitting: *Deleted

## 2017-03-13 ENCOUNTER — Other Ambulatory Visit: Payer: Self-pay

## 2017-03-13 DIAGNOSIS — Z0289 Encounter for other administrative examinations: Secondary | ICD-10-CM

## 2017-03-13 DIAGNOSIS — Z024 Encounter for examination for driving license: Secondary | ICD-10-CM

## 2017-03-13 LAB — DEPT OF TRANSP DIPSTICK, URINE (ARMC ONLY)
Glucose, UA: NEGATIVE mg/dL
HGB URINE DIPSTICK: NEGATIVE
Protein, ur: NEGATIVE mg/dL
Specific Gravity, Urine: 1.025 (ref 1.005–1.030)

## 2017-03-13 NOTE — ED Provider Notes (Signed)
[facility]    CSN: [id number] Arrival date & time: 03/13/17  1053     History   Chief Complaint Chief Complaint  Patient presents with  . Commercial Driver's License Exam    HPI Randall Bell is a 61 y.o. male.   Patient here for DOT Physical (see scanned form)   The history is provided by the patient.    Past Medical History:  Diagnosis Date  . Arthritis    neck, hands  . Bilateral hand numbness    pt thinks it may be Carpal Tunnel  . GERD (gastroesophageal reflux disease)   . Hypertension   . Wears dentures    partial upper    Patient Active Problem List   Diagnosis Date Noted  . Sleep apnea 03/11/2017  . Personal history of colonic polyps   . Benign neoplasm of descending colon   . Cellulitis of left hand 09/12/2016  . Obesity (BMI 30.0-34.9) 09/07/2016  . GERD (gastroesophageal reflux disease) 09/07/2016  . Vitamin D deficiency 08/02/2015  . Elevated liver enzymes 08/02/2015  . Hypertension 08/01/2015  . Benign prostatic hyperplasia 08/01/2015  . Genital warts 08/01/2015  . Paresthesia and pain of both upper extremities 08/01/2015  . Neck pain of over 3 months duration 08/01/2015  . FH: heart disease 08/01/2015  . FH: alpha 1 antitrypsin deficiency 08/01/2015  . Colon polyps 08/01/2015    Past Surgical History:  Procedure Laterality Date  . COLONOSCOPY  2011   repeat in 5 yrs/ polyps- [employer]  . COLONOSCOPY WITH PROPOFOL N/A 12/28/2016   Procedure: COLONOSCOPY WITH PROPOFOL;  Surgeon: Lucilla Lame, MD;  Location: [facility];  Service: Endoscopy;  Laterality: N/A;  . HERNIA REPAIR    . POLYPECTOMY  12/28/2016   Procedure: POLYPECTOMY;  Surgeon: Lucilla Lame, MD;  Location: [facility];  Service: Endoscopy;;       Home Medications    Prior to Admission medications   Medication Sig Start Date End Date Taking? Authorizing Provider  alfuzosin (UROXATRAL) 10 MG 24 hr tablet Take 1 tablet (10 mg total) by mouth  daily with breakfast. 03/11/17  Yes McGowan, Larene Beach A, PA-C  Cholecalciferol (VITAMIN D3) 3000 units TABS Take 1 tablet by mouth daily. 09/07/16  Yes Plonk, Gwyndolyn Saxon, MD  losartan (COZAAR) 100 MG tablet Take 1 tablet (100 mg total) by mouth daily. 09/07/16  Yes Plonk, Gwyndolyn Saxon, MD  Multiple Vitamins-Minerals (CENTRUM SILVER ADULT 50+ PO) Take 1 tablet by mouth daily.   Yes [provider]  acetaminophen (TYLENOL) 500 MG tablet Take 2 tablets (1,000 mg total) by mouth every 8 (eight) hours as needed. 03/12/17   Plonk, Gwyndolyn Saxon, MD  finasteride (PROSCAR) 5 MG tablet Take 1 tablet (5 mg total) by mouth daily. 03/11/17   Zara Council A, PA-C  metroNIDAZOLE (FLAGYL) 500 MG tablet Take 1 tablet (500 mg total) by mouth 2 (two) times daily. 03/12/17   Plonk, Gwyndolyn Saxon, MD  sulfamethoxazole-trimethoprim (BACTRIM DS,SEPTRA DS) 800-160 MG tablet Take 1 tablet by mouth 2 (two) times daily. 03/12/17   Adline Potter, MD    Family History Family History  Problem Relation Age of Onset  . Breast cancer Mother   . Heart disease Father   . Prostate cancer Father   . Kidney cancer Neg Hx   . Bladder Cancer Neg Hx     Social History Social History  Substance Use Topics  . Smoking status: Former Smoker    Types: Cigarettes    Quit date: 08/20/2014  .  Smokeless tobacco: Never Used  . Alcohol use 7.2 oz/week    12 Cans of beer per week     Comment:       Allergies   Lisinopril and Flomax [tamsulosin hcl]   Review of Systems Review of Systems   Physical Exam Triage Vital Signs ED Triage Vitals  Enc Vitals Group     BP 03/13/17 1117 133/82     Pulse Rate 03/13/17 1117 73     Resp 03/13/17 1117 16     Temp 03/13/17 1117 (!) 97.5 F (36.4 C)     Temp Source 03/13/17 1117 Oral     SpO2 03/13/17 1117 99 %     Weight --      Height --      Head Circumference --      Peak Flow --      Pain Score 03/13/17 1119 0     Pain Loc --      Pain Edu? --      Excl. in [place]? --    No data  found.   Updated Vital Signs BP 133/82 (BP Location: Left Arm)   Pulse 73   Temp (!) 97.5 F (36.4 C) (Oral)   Resp 16   SpO2 99%   Visual Acuity Right Eye Distance:   Left Eye Distance:   Bilateral Distance:    Right Eye Near:   Left Eye Near:    Bilateral Near:     Physical Exam   UC Treatments / Results  Labs (all labs ordered are listed, but only abnormal results are displayed) Labs Reviewed  DEPT OF TRANSP DIPSTICK, URINE([facility] ONLY)    EKG  EKG Interpretation None       Radiology No results found.  Procedures Procedures (including critical care time)  Medications Ordered in UC Medications - No data to display   Initial Impression / Assessment and Plan / UC Course  I have reviewed the triage vital signs and the nursing notes.  Pertinent labs & imaging results that were available during my care of the patient were reviewed by me and considered in my medical decision making (see chart for details).      Final Clinical Impressions(s) / UC Diagnoses   Final diagnoses:  Encounter for examination required by Department of Transportation (DOT)    New Prescriptions New Prescriptions   No medications on file   DOT Physical (medically qualified for 1 year; see scanned form)   Norval Gable, MD 03/13/17 1147

## 2017-03-13 NOTE — ED Triage Notes (Signed)
Commercial drivers license physical exam 

## 2017-03-15 ENCOUNTER — Telehealth: Payer: Self-pay

## 2017-03-15 NOTE — Telephone Encounter (Signed)
Recommend stop Flagyl, complete course Bactrim, call if sxs persist

## 2017-03-15 NOTE — Telephone Encounter (Signed)
Patient reports he is dizzy after taking Bactrim and Flagyl together and he looked it up and it says DO NOT take these together. Is there a different medication he can try?

## 2017-03-15 NOTE — Telephone Encounter (Signed)
Advised 

## 2017-04-01 ENCOUNTER — Ambulatory Visit
Admission: RE | Admit: 2017-04-01 | Discharge: 2017-04-01 | Disposition: A | Discharge status: Home / Self Care | Payer: BLUE CROSS/BLUE SHIELD | Source: Ambulatory Visit | Attending: Urology | Admitting: Urology

## 2017-04-01 DIAGNOSIS — N509 Disorder of male genital organs, unspecified: Secondary | ICD-10-CM | POA: Insufficient documentation

## 2017-04-01 DIAGNOSIS — N503 Cyst of epididymis: Secondary | ICD-10-CM | POA: Diagnosis not present

## 2017-04-01 DIAGNOSIS — N5089 Other specified disorders of the male genital organs: Secondary | ICD-10-CM

## 2017-04-11 ENCOUNTER — Telehealth: Payer: Self-pay

## 2017-04-11 NOTE — Telephone Encounter (Signed)
Pt called requesting u/s results over the phone. Pt stated u/s cost him $1000 out of pocket and is not able to afford co pay for Monday visit. Please advise.

## 2017-04-11 NOTE — Telephone Encounter (Signed)
The appointment wasn't just to discuss the scrotal ultrasound report.  I have started him on new medications and he needs blood work.

## 2017-04-12 NOTE — Telephone Encounter (Signed)
Angie gave pt message about appt.

## 2017-04-15 ENCOUNTER — Ambulatory Visit: Payer: BLUE CROSS/BLUE SHIELD | Admitting: Urology

## 2017-04-28 NOTE — [place] Notes (Signed)
04/29/2017 10:27 AM   Randall Bell 25-Jan-1956 [id number]  Referring provider: Adline Potter, MD [address] [place], [place] [address]  Chief Complaint  Patient presents with  . Benign Prostatic Hypertrophy    1 mo follow up  . Nocturia  . Results    scrotal US    HPI: Patient is a 61 year old Caucasian male with a scrotal mass, BPH with LUTS and nocturia who presents today for follow-up.   Patient was referred by his primary care physician, Dr. Adline Potter, for an enlarged prostate.  Scrotal mass At his initial visit, he stated that the area in his right epididymis has been enlarging over the last two years.  He states there is no pain.  Scrotal ultrasound performed on 04/01/2017 noted no significant testicular abnormalities.  Cysts at head of LEFT epididymis largest 17 mm diameter question epididymal cysts versus spermatocelse.  BPH WITH LUTS  (prostate and/or bladder) His IPSS score today is 15, which is moderate lower urinary tract symptomatology.  He is mixed with his quality life due to his urinary symptoms. His PVR is 90 mL.  His previous I PSS score was 29/3.  His previous PVR was 90 mL.  He was placed on alfuzosin 10 mg and finasteride 5 mg daily at his last visit.  He admits to not taking the medication consistently.    His major complaints today are frequency x 5-6, urgency, nocturia x 5, incontinence, intermittency, hesitancy, weak urinary stream and straining to urinate..  He has had these symptoms for several years.  He denies any dysuria, hematuria or suprapubic pain.     He also denies any recent fevers, chills, nausea or vomiting.  His father had prostate cancer.        IPSS    Row Name 03/11/17 1100 04/29/17 1000       International Prostate Symptom Score   How often have you had the sensation of not emptying your bladder? Almost always Less than half the time    How often have you had to urinate less than every two hours? More than  half the time Less than half the time    How often have you found you stopped and started again several times when you urinated? Almost always Less than 1 in 5 times    How often have you found it difficult to postpone urination? About half the time Less than half the time    How often have you had a weak urinary stream? Almost always Almost always    How often have you had to strain to start urination? About half the time Less than 1 in 5 times    How many times did you typically get up at night to urinate? 4 Times 2 Times    Total IPSS Score 29 15      Quality of Life due to urinary symptoms   If you were to spend the rest of your life with your urinary condition just the way it is now how would you feel about that? Mixed Mixed       Score:  1-7 Mild 8-19 Moderate 20-35 Severe   PMH: Past Medical History:  Diagnosis Date  . Arthritis    neck, hands  . Bilateral hand numbness    pt thinks it may be Carpal Tunnel  . GERD (gastroesophageal reflux disease)   . Hypertension   . Wears dentures    partial upper    Surgical History: Past  Surgical History:  Procedure Laterality Date  . COLONOSCOPY  2011   repeat in 5 yrs/ polyps- [employer]  . COLONOSCOPY WITH PROPOFOL N/A 12/28/2016   Procedure: COLONOSCOPY WITH PROPOFOL;  Surgeon: Lucilla Lame, MD;  Location: [facility];  Service: Endoscopy;  Laterality: N/A;  . HERNIA REPAIR    . POLYPECTOMY  12/28/2016   Procedure: POLYPECTOMY;  Surgeon: Lucilla Lame, MD;  Location: [facility];  Service: Endoscopy;;    Home Medications:  Allergies as of 04/29/2017      Reactions   Lisinopril Other (See Comments)   Dizziness and arthralgias   Flomax [tamsulosin Hcl] Other (See Comments)   Sexual dysfunction      Medication List       Accurate as of 04/29/17 10:27 AM. Always use your most recent med list.          acetaminophen 500 MG tablet Commonly known as:  TYLENOL Take 2 tablets (1,000 mg total) by mouth  every 8 (eight) hours as needed.   alfuzosin 10 MG 24 hr tablet Commonly known as:  UROXATRAL Take 1 tablet (10 mg total) by mouth daily with breakfast.   CENTRUM SILVER ADULT 50+ PO Take 1 tablet by mouth daily.   finasteride 5 MG tablet Commonly known as:  PROSCAR Take 1 tablet (5 mg total) by mouth daily.   losartan 100 MG tablet Commonly known as:  COZAAR Take 1 tablet (100 mg total) by mouth daily.   metroNIDAZOLE 500 MG tablet Commonly known as:  FLAGYL Take 1 tablet (500 mg total) by mouth 2 (two) times daily.   sulfamethoxazole-trimethoprim 800-160 MG tablet Commonly known as:  BACTRIM DS,SEPTRA DS Take 1 tablet by mouth 2 (two) times daily.   Vitamin D3 3000 units Tabs Take 1 tablet by mouth daily.            Discharge Care Instructions        Start     Ordered   04/29/17 0000  PSA     04/29/17 1003   04/29/17 0000  BLADDER SCAN AMB NON-IMAGING     04/29/17 1003      Allergies:  Allergies  Allergen Reactions  . Lisinopril Other (See Comments)    Dizziness and arthralgias  . Flomax [Tamsulosin Hcl] Other (See Comments)    Sexual dysfunction    Family History: Family History  Problem Relation Age of Onset  . Breast cancer Mother   . Heart disease Father   . Prostate cancer Father   . Kidney cancer Neg Hx   . Bladder Cancer Neg Hx     Social History:  reports that he quit smoking about 2 years ago. His smoking use included Cigarettes. He has never used smokeless tobacco. He reports that he drinks about 7.2 oz of alcohol per week . He reports that he does not use drugs.  ROS: UROLOGY Frequent Urination?: Yes Hard to postpone urination?: Yes Burning/pain with urination?: No Get up at night to urinate?: Yes Leakage of urine?: Yes Urine stream starts and stops?: Yes Trouble starting stream?: Yes Do you have to strain to urinate?: No Blood in urine?: No Urinary tract infection?: No Sexually transmitted disease?: No Injury to kidneys or  bladder?: No Painful intercourse?: No Weak stream?: Yes Erection problems?: No Penile pain?: No  Gastrointestinal Nausea?: No Vomiting?: No Indigestion/heartburn?: No Diarrhea?: No Constipation?: No  Constitutional Fever: No Night sweats?: No Weight loss?: No Fatigue?: Yes  Skin Skin rash/lesions?: No Itching?: No  Eyes  Blurred vision?: No Double vision?: No  Ears/Nose/Throat Sore throat?: No Sinus problems?: Yes  Hematologic/Lymphatic Swollen glands?: No Easy bruising?: Yes  Cardiovascular Leg swelling?: Yes Chest pain?: No  Respiratory Cough?: No Shortness of breath?: No  Endocrine Excessive thirst?: No  Musculoskeletal Back pain?: Yes Joint pain?: No  Neurological Headaches?: No Dizziness?: No  Psychologic Depression?: No Anxiety?: No  Physical Exam: BP (!) 161/107   Pulse 80   Ht 6\' 1"  (1.854 m)   Wt 249 lb 3.2 oz (113 kg)   BMI 32.88 kg/m   Constitutional: Well nourished. Alert and oriented, No acute distress. HEENT: [place] AT, moist mucus membranes. Trachea midline, no masses. Cardiovascular: No clubbing, cyanosis, or edema. Respiratory: Normal respiratory effort, no increased work of breathing. Skin: No rashes, bruises or suspicious lesions. Lymph: No cervical or inguinal adenopathy. Neurologic: Grossly intact, no focal deficits, moving all 4 extremities. Psychiatric: Normal mood and affect.  Laboratory Data:  Lab Results  Component Value Date   CREATININE 1.14 09/07/2016    Lab Results  Component Value Date   AST 40 09/07/2016   Lab Results  Component Value Date   ALT 56 (H) 09/07/2016    I have independently reviewed the labs.   Pertinent Imaging: CLINICAL DATA:  LEFT epididymal mass for 7 years, now increased in size  EXAM: SCROTAL ULTRASOUND  DOPPLER ULTRASOUND OF THE TESTICLES  TECHNIQUE: Complete ultrasound examination of the testicles, epididymis, and other scrotal structures was performed. Color  and spectral Doppler ultrasound were also utilized to evaluate blood flow to the testicles.  COMPARISON:  None  FINDINGS: Right testicle  Measurements: 5.0 x 2.3 x 4.0 cm. Normal morphology without mass or calcification. Internal blood flow present on color Doppler imaging.  Left testicle  Measurements: 4.3 x 2.5 x 3.4 cm. Normal morphology without mass or calcification. Focal area of mildly heterogeneous echogenicity likely reflects minimal tubular ectasia of the rete testis. Internal blood flow present on color Doppler imaging.  Right epididymis:  Normal in size and appearance.  Left epididymis: Large cyst at head of LEFT epididymis 17 x 14 x 12 mm. Smaller cyst with internal debris at head of epididymis 8 x 5 x 5 mm.  Hydrocele:  Absent bilaterally  Varicocele:  Absent bilaterally  Pulsed Doppler interrogation of both testes demonstrates normal low resistance arterial and venous waveforms bilaterally.  IMPRESSION: No significant testicular abnormalities.  Cysts at head of LEFT epididymis largest 17 mm diameter question epididymal cysts versus spermatocelse.   Electronically Signed   By: Lavonia Dana M.D.   On: 04/01/2017 09:15  I have independently reviewed the films.    Assessment & Plan:    1. BPH with LUTS  - IPSS score is 15/3, it is improving  - Continue conservative management, avoiding bladder irritants and timed voiding's  - most bothersome symptoms is/are frequency and nocturia  - PSA  - Continue the alfuzosin 10 mg and finasteride 5 mg   - RTC in one month for IPSS, PSA and PVR   2. Nocturia  - patient will sleep apnea and sleeping with CPAP machine  - improving  3. 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  These notes generated with voice recognition software. I apologize for typographical errors.  Zara Council, [facility] [address], [place], [place] [address] [phone]

## 2017-04-29 ENCOUNTER — Encounter: Payer: Self-pay | Admitting: Urology

## 2017-04-29 ENCOUNTER — Ambulatory Visit (INDEPENDENT_AMBULATORY_CARE_PROVIDER_SITE_OTHER): Payer: BLUE CROSS/BLUE SHIELD | Admitting: Urology

## 2017-04-29 VITALS — BP 161/107 | HR 80 | Ht 73.0 in | Wt 249.2 lb

## 2017-04-29 DIAGNOSIS — N4340 Spermatocele of epididymis, unspecified: Secondary | ICD-10-CM

## 2017-04-29 DIAGNOSIS — R351 Nocturia: Secondary | ICD-10-CM | POA: Diagnosis not present

## 2017-04-29 DIAGNOSIS — N401 Enlarged prostate with lower urinary tract symptoms: Secondary | ICD-10-CM

## 2017-04-29 DIAGNOSIS — N138 Other obstructive and reflux uropathy: Secondary | ICD-10-CM

## 2017-04-29 NOTE — Addendum Note (Signed)
Addended by: Orlene Erm on: 04/29/2017 11:23 AM   Modules accepted: Orders

## 2017-04-30 ENCOUNTER — Telehealth: Payer: Self-pay | Admitting: Family Medicine

## 2017-04-30 LAB — PSA: Prostate Specific Ag, Serum: 0.9 ng/mL (ref 0.0–4.0)

## 2017-04-30 NOTE — Telephone Encounter (Signed)
-----   Message from Nori Riis, PA-C sent at 04/30/2017  8:00 AM EDT ----- Please let Mr. Degroat know that his PSA is normal.

## 2017-04-30 NOTE — Telephone Encounter (Signed)
Left message for patient to return call.

## 2017-05-03 NOTE — Telephone Encounter (Signed)
LMOM that patient recent labs were normal and if any questions please call the office.

## 2017-05-20 DIAGNOSIS — M5013 Cervical disc disorder with radiculopathy, cervicothoracic region: Secondary | ICD-10-CM | POA: Diagnosis not present

## 2017-05-20 DIAGNOSIS — M9901 Segmental and somatic dysfunction of cervical region: Secondary | ICD-10-CM | POA: Diagnosis not present

## 2017-05-20 DIAGNOSIS — M50122 Cervical disc disorder at C5-C6 level with radiculopathy: Secondary | ICD-10-CM | POA: Diagnosis not present

## 2017-05-20 DIAGNOSIS — M50123 Cervical disc disorder at C6-C7 level with radiculopathy: Secondary | ICD-10-CM | POA: Diagnosis not present

## 2017-06-10 DIAGNOSIS — M5013 Cervical disc disorder with radiculopathy, cervicothoracic region: Secondary | ICD-10-CM | POA: Diagnosis not present

## 2017-06-10 DIAGNOSIS — M50122 Cervical disc disorder at C5-C6 level with radiculopathy: Secondary | ICD-10-CM | POA: Diagnosis not present

## 2017-06-10 DIAGNOSIS — M50123 Cervical disc disorder at C6-C7 level with radiculopathy: Secondary | ICD-10-CM | POA: Diagnosis not present

## 2017-06-10 DIAGNOSIS — M9901 Segmental and somatic dysfunction of cervical region: Secondary | ICD-10-CM | POA: Diagnosis not present

## 2017-06-14 DIAGNOSIS — M9901 Segmental and somatic dysfunction of cervical region: Secondary | ICD-10-CM | POA: Diagnosis not present

## 2017-06-14 DIAGNOSIS — M50123 Cervical disc disorder at C6-C7 level with radiculopathy: Secondary | ICD-10-CM | POA: Diagnosis not present

## 2017-06-14 DIAGNOSIS — M5013 Cervical disc disorder with radiculopathy, cervicothoracic region: Secondary | ICD-10-CM | POA: Diagnosis not present

## 2017-06-14 DIAGNOSIS — M50122 Cervical disc disorder at C5-C6 level with radiculopathy: Secondary | ICD-10-CM | POA: Diagnosis not present

## 2017-06-26 DIAGNOSIS — M5013 Cervical disc disorder with radiculopathy, cervicothoracic region: Secondary | ICD-10-CM | POA: Diagnosis not present

## 2017-06-26 DIAGNOSIS — M50123 Cervical disc disorder at C6-C7 level with radiculopathy: Secondary | ICD-10-CM | POA: Diagnosis not present

## 2017-06-26 DIAGNOSIS — M50122 Cervical disc disorder at C5-C6 level with radiculopathy: Secondary | ICD-10-CM | POA: Diagnosis not present

## 2017-06-26 DIAGNOSIS — M9901 Segmental and somatic dysfunction of cervical region: Secondary | ICD-10-CM | POA: Diagnosis not present

## 2017-07-10 DIAGNOSIS — M50123 Cervical disc disorder at C6-C7 level with radiculopathy: Secondary | ICD-10-CM | POA: Diagnosis not present

## 2017-07-10 DIAGNOSIS — M5013 Cervical disc disorder with radiculopathy, cervicothoracic region: Secondary | ICD-10-CM | POA: Diagnosis not present

## 2017-07-10 DIAGNOSIS — M9901 Segmental and somatic dysfunction of cervical region: Secondary | ICD-10-CM | POA: Diagnosis not present

## 2017-07-10 DIAGNOSIS — M50122 Cervical disc disorder at C5-C6 level with radiculopathy: Secondary | ICD-10-CM | POA: Diagnosis not present

## 2017-08-05 DIAGNOSIS — M9901 Segmental and somatic dysfunction of cervical region: Secondary | ICD-10-CM | POA: Diagnosis not present

## 2017-08-05 DIAGNOSIS — M50122 Cervical disc disorder at C5-C6 level with radiculopathy: Secondary | ICD-10-CM | POA: Diagnosis not present

## 2017-08-05 DIAGNOSIS — M50123 Cervical disc disorder at C6-C7 level with radiculopathy: Secondary | ICD-10-CM | POA: Diagnosis not present

## 2017-08-05 DIAGNOSIS — M5013 Cervical disc disorder with radiculopathy, cervicothoracic region: Secondary | ICD-10-CM | POA: Diagnosis not present

## 2017-08-16 DIAGNOSIS — M5013 Cervical disc disorder with radiculopathy, cervicothoracic region: Secondary | ICD-10-CM | POA: Diagnosis not present

## 2017-08-16 DIAGNOSIS — M50123 Cervical disc disorder at C6-C7 level with radiculopathy: Secondary | ICD-10-CM | POA: Diagnosis not present

## 2017-08-16 DIAGNOSIS — M9901 Segmental and somatic dysfunction of cervical region: Secondary | ICD-10-CM | POA: Diagnosis not present

## 2017-08-16 DIAGNOSIS — M50122 Cervical disc disorder at C5-C6 level with radiculopathy: Secondary | ICD-10-CM | POA: Diagnosis not present

## 2017-10-07 DIAGNOSIS — M5013 Cervical disc disorder with radiculopathy, cervicothoracic region: Secondary | ICD-10-CM | POA: Diagnosis not present

## 2017-10-07 DIAGNOSIS — M50122 Cervical disc disorder at C5-C6 level with radiculopathy: Secondary | ICD-10-CM | POA: Diagnosis not present

## 2017-10-07 DIAGNOSIS — M9901 Segmental and somatic dysfunction of cervical region: Secondary | ICD-10-CM | POA: Diagnosis not present

## 2017-10-07 DIAGNOSIS — M50123 Cervical disc disorder at C6-C7 level with radiculopathy: Secondary | ICD-10-CM | POA: Diagnosis not present

## 2017-10-20 ENCOUNTER — Other Ambulatory Visit: Payer: Self-pay | Admitting: Family Medicine

## 2018-03-24 ENCOUNTER — Other Ambulatory Visit: Payer: Self-pay | Admitting: Urology

## 2018-07-07 IMAGING — CR DG FINGER INDEX 2+V*L*
3 series · 3 of 3 positions shown · non-contrast
Comparison: None.

CLINICAL DATA: Cellulitis of left index finger.

EXAM:
LEFT INDEX FINGER 2+V

[finger ap]
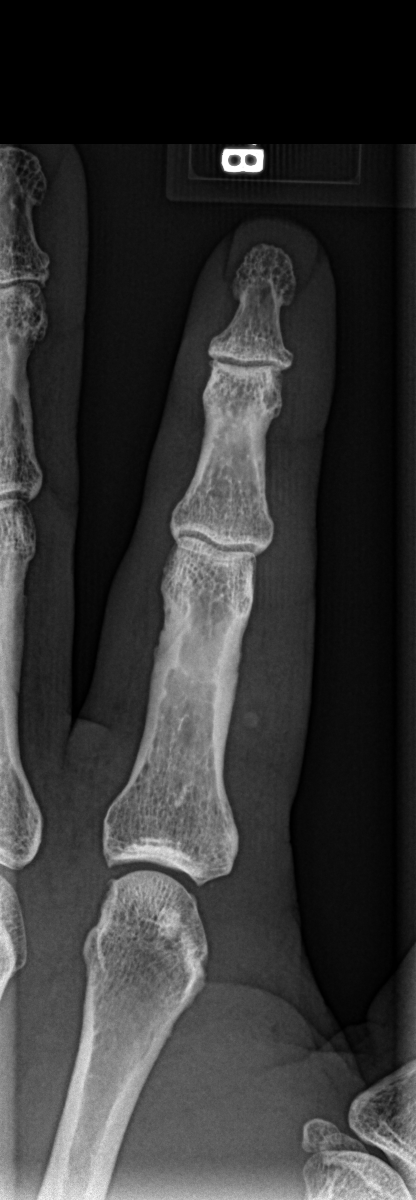

[finger obl]
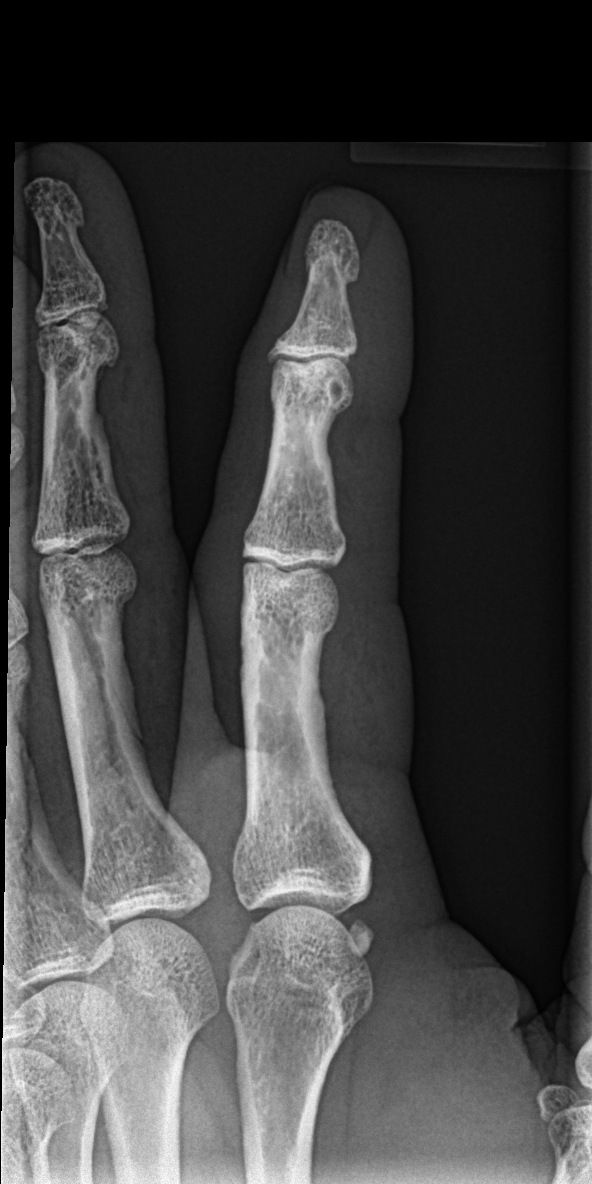

[finger lat]
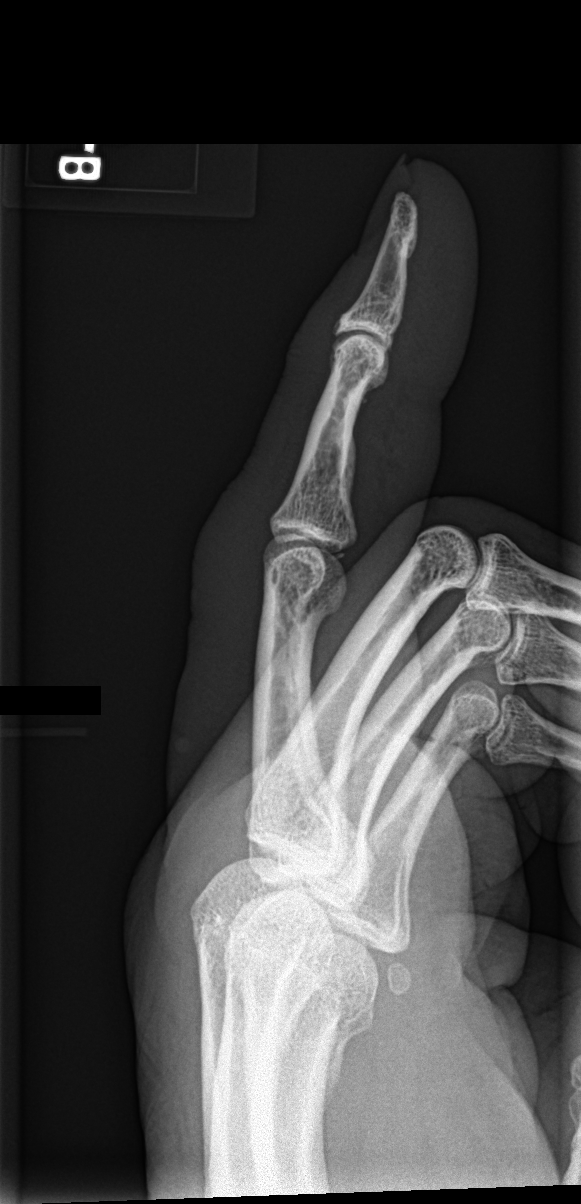

[3 of 3 positions shown; findings below may reference images not displayed]

FINDINGS: Mild degenerative changes in the IP joints with joint space
narrowing and spurring. No acute bony abnormality. Specifically, no
fracture, subluxation, or dislocation. Soft tissues are intact. Mild
diffuse soft tissue swelling. No radiopaque foreign body. No bony
changes of osteomyelitis.
IMPRESSION: No acute bony abnormality.

## 2018-09-19 DIAGNOSIS — L03313 Cellulitis of chest wall: Secondary | ICD-10-CM | POA: Diagnosis not present

## 2018-09-19 DIAGNOSIS — I10 Essential (primary) hypertension: Secondary | ICD-10-CM | POA: Diagnosis not present

## 2018-09-19 DIAGNOSIS — L02411 Cutaneous abscess of right axilla: Secondary | ICD-10-CM | POA: Diagnosis not present

## 2018-09-19 DIAGNOSIS — Z6832 Body mass index (BMI) 32.0-32.9, adult: Secondary | ICD-10-CM | POA: Diagnosis not present

## 2018-09-19 DIAGNOSIS — L02213 Cutaneous abscess of chest wall: Secondary | ICD-10-CM | POA: Diagnosis not present

## 2018-09-19 DIAGNOSIS — Z87891 Personal history of nicotine dependence: Secondary | ICD-10-CM | POA: Diagnosis not present

## 2019-02-02 ENCOUNTER — Encounter: Payer: Self-pay | Admitting: Family Medicine

## 2019-02-02 ENCOUNTER — Other Ambulatory Visit: Payer: Self-pay

## 2019-02-02 ENCOUNTER — Ambulatory Visit (INDEPENDENT_AMBULATORY_CARE_PROVIDER_SITE_OTHER): Payer: BC Managed Care – PPO | Admitting: Family Medicine

## 2019-02-02 VITALS — BP 130/80 | HR 64 | Ht 73.0 in | Wt 261.0 lb

## 2019-02-02 DIAGNOSIS — I10 Essential (primary) hypertension: Secondary | ICD-10-CM | POA: Diagnosis not present

## 2019-02-02 DIAGNOSIS — D179 Benign lipomatous neoplasm, unspecified: Secondary | ICD-10-CM

## 2019-02-02 DIAGNOSIS — E559 Vitamin D deficiency, unspecified: Secondary | ICD-10-CM

## 2019-02-02 DIAGNOSIS — M7711 Lateral epicondylitis, right elbow: Secondary | ICD-10-CM

## 2019-02-02 DIAGNOSIS — E669 Obesity, unspecified: Secondary | ICD-10-CM

## 2019-02-02 DIAGNOSIS — M542 Cervicalgia: Secondary | ICD-10-CM | POA: Diagnosis not present

## 2019-02-02 DIAGNOSIS — Z7689 Persons encountering health services in other specified circumstances: Secondary | ICD-10-CM | POA: Diagnosis not present

## 2019-02-02 DIAGNOSIS — E782 Mixed hyperlipidemia: Secondary | ICD-10-CM | POA: Diagnosis not present

## 2019-02-02 DIAGNOSIS — M7712 Lateral epicondylitis, left elbow: Secondary | ICD-10-CM

## 2019-02-02 MED ORDER — MELOXICAM 15 MG PO TABS: 15.0000 mg | ORAL_TABLET | Freq: Every day | ORAL | 0 refills | Status: DC

## 2019-02-02 MED ORDER — LOSARTAN POTASSIUM 100 MG PO TABS: 100.0000 mg | ORAL_TABLET | Freq: Every day | ORAL | 1 refills | Status: DC

## 2019-02-02 NOTE — Patient Instructions (Signed)

## 2019-02-02 NOTE — Progress Notes (Signed)
Date:  02/02/2019   Name:  Randall Bell   DOB:  11/02/1955   MRN:  [id number]   Chief Complaint: Establish Care, Mass (on R) wrist and L) thigh), and Elbow Pain (started in the wrist and has moved to elbow- drives a truck)  Patient is a 63 year old male who presents for a comprehensive physic exam. The patient reports the following problems: multiple areas of pain Health maintenance has been reviewed htn/omega 3 forlipid.  Hyperlipidemia This is a chronic problem. The current episode started more than 1 year ago. The problem is controlled. Recent lipid tests were reviewed and are normal. He has no history of chronic renal disease, diabetes, hypothyroidism, liver disease, obesity or nephrotic syndrome. Pertinent negatives include no chest pain, focal sensory loss, focal weakness, leg pain, myalgias or shortness of breath. Treatments tried: omega 3. The current treatment provides moderate improvement of lipids. There are no compliance problems.  Risk factors for coronary artery disease include dyslipidemia, obesity and post-menopausal.  Neck Pain  This is a chronic problem. The current episode started more than 1 year ago. The problem occurs intermittently. The problem has been gradually worsening. The quality of the pain is described as aching. The pain is moderate. Associated symptoms include numbness, tingling and weakness. Pertinent negatives include no chest pain, fever, headaches, leg pain, pain with swallowing, paresis, photophobia, syncope, trouble swallowing, visual change or weight loss. He has tried acetaminophen and NSAIDs for the symptoms.    Review of Systems  Constitutional: Negative for chills, fever and weight loss.  HENT: Negative for drooling, ear discharge, ear pain, sore throat and trouble swallowing.   Eyes: Negative for photophobia.  Respiratory: Negative for cough, shortness of breath and wheezing.   Cardiovascular: Negative for chest pain, palpitations, leg swelling  and syncope.  Gastrointestinal: Negative for abdominal pain, blood in stool, constipation, diarrhea and nausea.  Endocrine: Negative for polydipsia.  Genitourinary: Negative for dysuria, frequency, hematuria and urgency.  Musculoskeletal: Negative for back pain, myalgias and neck pain.  Skin: Negative for rash.  Allergic/Immunologic: Negative for environmental allergies.  Neurological: Positive for tingling, weakness and numbness. Negative for dizziness, focal weakness and headaches.  Hematological: Does not bruise/bleed easily.  Psychiatric/Behavioral: Negative for suicidal ideas. The patient is not nervous/anxious.     Patient Active Problem List   Diagnosis Date Noted  . Sleep apnea 03/11/2017  . Personal history of colonic polyps   . Benign neoplasm of descending colon   . Cellulitis of left hand 09/12/2016  . Obesity (BMI 30.0-34.9) 09/07/2016  . GERD (gastroesophageal reflux disease) 09/07/2016  . Vitamin D deficiency 08/02/2015  . Elevated liver enzymes 08/02/2015  . Hypertension 08/01/2015  . Benign prostatic hyperplasia 08/01/2015  . Genital warts 08/01/2015  . Paresthesia and pain of both upper extremities 08/01/2015  . Neck pain of over 3 months duration 08/01/2015  . FH: heart disease 08/01/2015  . FH: alpha 1 antitrypsin deficiency 08/01/2015  . Colon polyps 08/01/2015    Allergies  Allergen Reactions  . Lisinopril Other (See Comments)    Dizziness and arthralgias  . Flomax [Tamsulosin Hcl] Other (See Comments)    Sexual dysfunction    Past Surgical History:  Procedure Laterality Date  . COLONOSCOPY  2011   repeat in 5 yrs/ polyps- [employer]  . COLONOSCOPY WITH PROPOFOL N/A 12/28/2016   Procedure: COLONOSCOPY WITH PROPOFOL;  Surgeon: Lucilla Lame, MD;  Location: [facility];  Service: Endoscopy;  Laterality: N/A;  . HERNIA  REPAIR    . POLYPECTOMY  12/28/2016   Procedure: POLYPECTOMY;  Surgeon: Lucilla Lame, MD;  Location: [place];   Service: Endoscopy;;    Social History   Tobacco Use  . Smoking status: Former Smoker    Types: Cigarettes    Quit date: 08/20/2014    Years since quitting: 4.4  . Smokeless tobacco: Never Used  Substance Use Topics  . Alcohol use: Yes    Alcohol/week: 12.0 standard drinks    Types: 12 Cans of beer per week    Comment:    . Drug use: No     Medication list has been reviewed and updated.  Current Meds  Medication Sig  . acetaminophen (TYLENOL) 500 MG tablet Take 2 tablets (1,000 mg total) by mouth every 8 (eight) hours as needed.  . Cholecalciferol (VITAMIN D3) 3000 units TABS Take 1 tablet by mouth daily.  Marland Kitchen losartan (COZAAR) 100 MG tablet TAKE ONE TABLET BY MOUTH ONCE DAILY  . Multiple Vitamins-Minerals (CENTRUM SILVER ADULT 50+ PO) Take 1 tablet by mouth daily.    PHQ 2/9 Scores 02/02/2019 08/01/2015  PHQ - 2 Score 0 0  PHQ- 9 Score 0 -    BP Readings from Last 3 Encounters:  02/02/19 130/80  04/29/17 (!) 161/107  03/13/17 133/82    Physical Exam Vitals signs reviewed.  HENT:     Head: Normocephalic.     Right Ear: External ear normal.     Left Ear: External ear normal.     Nose: Nose normal.  Eyes:     General: No scleral icterus.       Right eye: No discharge.        Left eye: No discharge.     Conjunctiva/sclera: Conjunctivae normal.     Pupils: Pupils are equal, round, and reactive to light.  Neck:     Musculoskeletal: Normal range of motion and neck supple.     Thyroid: No thyromegaly.     Vascular: No JVD.     Trachea: No tracheal deviation.  Cardiovascular:     Rate and Rhythm: Normal rate and regular rhythm.     Heart sounds: Normal heart sounds. No murmur. No friction rub. No gallop.   Pulmonary:     Effort: No respiratory distress.     Breath sounds: Normal breath sounds. No wheezing or rales.  Abdominal:     General: Bowel sounds are normal.     Palpations: Abdomen is soft. There is no mass.     Tenderness: There is no abdominal  tenderness. There is no guarding or rebound.  Musculoskeletal: Normal range of motion.        General: No tenderness.  Lymphadenopathy:     Cervical: No cervical adenopathy.  Skin:    General: Skin is warm.     Findings: No rash.  Neurological:     Mental Status: He is alert and oriented to person, place, and time.     Cranial Nerves: No cranial nerve deficit.     Deep Tendon Reflexes: Reflexes are normal and symmetric.     Wt Readings from Last 3 Encounters:  02/02/19 261 lb (118.4 kg)  04/29/17 249 lb 3.2 oz (113 kg)  03/12/17 241 lb (109.3 kg)    BP 130/80   Pulse 64   Ht 6\' 1"  (1.854 m)   Wt 261 lb (118.4 kg)   BMI 34.43 kg/m   Assessment and Plan: 1. Establishing care with new doctor, encounter for Patient is  establishing care with me as a new physician but has previously been taking care of by Dr. Roosvelt Harps last seen in 2018.  Previous chart was reviewed for encounters, labs, procedures, and imaging.  Previous specialties were reviewed as well.  2. Essential hypertension Chronic.  Controlled.  Unable to tolerate lisinopril but was presently on losartan 100 mg 1 a day.  Will refill at this time and recheck in 6 months. - losartan (COZAAR) 100 MG tablet; Take 1 tablet (100 mg total) by mouth daily.  Dispense: 90 tablet; Refill: 1  3. Moderate mixed hyperlipidemia not requiring statin therapy Chronic.  Controlled.  Will continue omega-3.  4. Cervicalgia And has a history of cervicalgia with radiculopathy.  Is previously seen [facility] Monday.  Will resume meloxicam 15 mg once a day. - meloxicam (MOBIC) 15 MG tablet; Take 1 tablet (15 mg total) by mouth daily.  Dispense: 30 tablet; Refill: 0 - Ambulatory referral to Orthopedic Surgery  5. 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  Patient is currently wearing a strap on the right arm.  Will refill meloxicam 15 mg once  a day and refer to orthopedic surgery for evaluation. - meloxicam (MOBIC) 15 MG tablet; Take 1 tablet (15 mg total) by mouth daily.  Dispense: 30 tablet; Refill: 0 - Ambulatory referral to Orthopedic Surgery  6. Multiple lipomas He notes multiple lipomas primarily of the right arm.  Also has a lipoma 1 of the left posterior thigh.  These have not changed significantly in size recently and it was discussed with patient that we can refer in the not too distant future for general surgery to evaluate and remove if he so desires.  7. Obesity (BMI 30.0-34.9) Patient is noted to have a BMI of 34.43.  We will give him a calorie reducing diet to initiate to try the best he can.  It was noted in the past that he has a decrease in vitamin D for which he takes 3000 units  8. Vitamin D deficiency It was noted in the past that he has a history of decrease in vitamin D which he is taking vitamin D3 3000 units daily.

## 2019-03-02 DIAGNOSIS — M542 Cervicalgia: Secondary | ICD-10-CM | POA: Diagnosis not present

## 2019-03-02 DIAGNOSIS — M5412 Radiculopathy, cervical region: Secondary | ICD-10-CM | POA: Diagnosis not present

## 2019-03-02 DIAGNOSIS — G5603 Carpal tunnel syndrome, bilateral upper limbs: Secondary | ICD-10-CM | POA: Diagnosis not present

## 2019-04-03 ENCOUNTER — Encounter: Payer: Self-pay | Admitting: Family Medicine

## 2019-04-03 ENCOUNTER — Other Ambulatory Visit: Payer: Self-pay

## 2019-04-03 ENCOUNTER — Ambulatory Visit (INDEPENDENT_AMBULATORY_CARE_PROVIDER_SITE_OTHER): Payer: BC Managed Care – PPO | Admitting: Family Medicine

## 2019-04-03 VITALS — BP 130/70 | HR 72 | Ht 73.0 in | Wt 248.0 lb

## 2019-04-03 DIAGNOSIS — K30 Functional dyspepsia: Secondary | ICD-10-CM

## 2019-04-03 DIAGNOSIS — R1310 Dysphagia, unspecified: Secondary | ICD-10-CM

## 2019-04-03 DIAGNOSIS — R1319 Other dysphagia: Secondary | ICD-10-CM

## 2019-04-03 MED ORDER — PANTOPRAZOLE SODIUM 40 MG PO TBEC: 40.0000 mg | DELAYED_RELEASE_TABLET | Freq: Every day | ORAL | 3 refills | Status: DC

## 2019-04-03 NOTE — Progress Notes (Signed)
Date:  04/03/2019   Name:  Randall Bell   DOB:  1955-08-30   MRN:  [id number]   Chief Complaint: belching (loud belching, "regurgitating food, "close eyes tight, it feels like something is pulling from throat')  GI Problem Primary symptoms do not include fever, weight loss, fatigue, abdominal pain, nausea, vomiting, diarrhea, melena, hematemesis, jaundice, hematochezia, dysuria, myalgias, arthralgias or rash. Primary symptoms comment: for dysphagia.  The illness is also significant for bloating. The illness does not include chills, anorexia, dysphagia, odynophagia, constipation, tenesmus, back pain or itching.    Review of Systems  Constitutional: Negative for appetite change, chills, fatigue, fever, unexpected weight change and weight loss.  HENT: Negative for drooling, ear discharge, ear pain, facial swelling, hearing loss, nosebleeds, postnasal drip, rhinorrhea, sinus pressure, sinus pain, sneezing, sore throat and trouble swallowing.   Eyes: Negative for photophobia, pain, discharge, redness, itching and visual disturbance.  Respiratory: Negative for cough, choking, chest tightness, shortness of breath and wheezing.   Cardiovascular: Negative for chest pain, palpitations and leg swelling.  Gastrointestinal: Positive for bloating. Negative for abdominal pain, anorexia, blood in stool, constipation, diarrhea, dysphagia, hematemesis, hematochezia, jaundice, melena, nausea, rectal pain and vomiting.  Endocrine: Negative for cold intolerance, heat intolerance, polydipsia, polyphagia and polyuria.  Genitourinary: Negative for decreased urine volume, difficulty urinating, discharge, dysuria, flank pain, frequency, hematuria, penile pain, penile swelling, scrotal swelling, testicular pain and urgency.  Musculoskeletal: Negative for arthralgias, back pain, joint swelling, myalgias, neck pain and neck stiffness.  Skin: Negative for color change, itching and rash.  Allergic/Immunologic:  Negative for environmental allergies and immunocompromised state.  Neurological: Negative for dizziness, tremors, seizures, syncope, speech difficulty, weakness, light-headedness, numbness and headaches.  Hematological: Does not bruise/bleed easily.  Psychiatric/Behavioral: Negative for agitation, behavioral problems, confusion, dysphoric mood, hallucinations, self-injury and suicidal ideas. The patient is not nervous/anxious.     Patient Active Problem List   Diagnosis Date Noted  . Sleep apnea 03/11/2017  . Personal history of colonic polyps   . Benign neoplasm of descending colon   . Cellulitis of left hand 09/12/2016  . Obesity (BMI 30.0-34.9) 09/07/2016  . GERD (gastroesophageal reflux disease) 09/07/2016  . Vitamin D deficiency 08/02/2015  . Elevated liver enzymes 08/02/2015  . Hypertension 08/01/2015  . Benign prostatic hyperplasia 08/01/2015  . Genital warts 08/01/2015  . Paresthesia and pain of both upper extremities 08/01/2015  . Neck pain of over 3 months duration 08/01/2015  . FH: heart disease 08/01/2015  . FH: alpha 1 antitrypsin deficiency 08/01/2015  . Colon polyps 08/01/2015    Allergies  Allergen Reactions  . Lisinopril Other (See Comments)    Dizziness and arthralgias  . Flomax [Tamsulosin Hcl] Other (See Comments)    Sexual dysfunction    Past Surgical History:  Procedure Laterality Date  . COLONOSCOPY  2011   repeat in 5 yrs/ polyps- [employer]  . COLONOSCOPY WITH PROPOFOL N/A 12/28/2016   Procedure: COLONOSCOPY WITH PROPOFOL;  Surgeon: Lucilla Lame, MD;  Location: [facility];  Service: Endoscopy;  Laterality: N/A;  . HERNIA REPAIR    . POLYPECTOMY  12/28/2016   Procedure: POLYPECTOMY;  Surgeon: Lucilla Lame, MD;  Location: [facility];  Service: Endoscopy;;    Social History   Tobacco Use  . Smoking status: Former Smoker    Types: Cigarettes    Quit date: 08/20/2014    Years since quitting: 4.6  . Smokeless tobacco: Never Used   Substance Use Topics  . Alcohol use: Yes  Alcohol/week: 12.0 standard drinks    Types: 12 Cans of beer per week    Comment:    . Drug use: No     Medication list has been reviewed and updated.  Current Meds  Medication Sig  . acetaminophen (TYLENOL) 500 MG tablet Take 2 tablets (1,000 mg total) by mouth every 8 (eight) hours as needed.  . Cholecalciferol (VITAMIN D3) 3000 units TABS Take 1 tablet by mouth daily.  Marland Kitchen losartan (COZAAR) 100 MG tablet Take 1 tablet (100 mg total) by mouth daily.  . meloxicam (MOBIC) 15 MG tablet Take 1 tablet (15 mg total) by mouth daily.  . Multiple Vitamins-Minerals (CENTRUM SILVER ADULT 50+ PO) Take 1 tablet by mouth daily.    PHQ 2/9 Scores 04/03/2019 02/02/2019 08/01/2015  PHQ - 2 Score 0 0 0  PHQ- 9 Score 0 0 -    BP Readings from Last 3 Encounters:  04/03/19 130/70  02/02/19 130/80  04/29/17 (!) 161/107    Physical Exam Vitals signs and nursing note reviewed.  HENT:     Head: Normocephalic.     Right Ear: Tympanic membrane, ear canal and external ear normal. There is no impacted cerumen.     Left Ear: Tympanic membrane, ear canal and external ear normal. There is no impacted cerumen.     Nose: Nose normal. No congestion or rhinorrhea.     Mouth/Throat:     Mouth: Mucous membranes are moist.  Eyes:     General: No scleral icterus.       Right eye: No discharge.        Left eye: No discharge.     Conjunctiva/sclera: Conjunctivae normal.     Pupils: Pupils are equal, round, and reactive to light.  Neck:     Musculoskeletal: Normal range of motion and neck supple.     Thyroid: No thyroid mass, thyromegaly or thyroid tenderness.     Vascular: No carotid bruit or JVD.     Trachea: No tracheal deviation.  Cardiovascular:     Rate and Rhythm: Normal rate and regular rhythm.     Heart sounds: Normal heart sounds. No murmur. No friction rub. No gallop.   Pulmonary:     Effort: No respiratory distress.     Breath sounds: Normal breath  sounds. No wheezing or rales.  Abdominal:     General: Bowel sounds are normal.     Palpations: Abdomen is soft. There is no mass.     Tenderness: There is no abdominal tenderness. There is no guarding or rebound.  Musculoskeletal: Normal range of motion.        General: No tenderness.  Lymphadenopathy:     Head:     Right side of head: No submental, submandibular or tonsillar adenopathy.     Left side of head: No submental, submandibular or tonsillar adenopathy.     Cervical: No cervical adenopathy.     Right cervical: No superficial, deep or posterior cervical adenopathy.    Left cervical: No superficial, deep or posterior cervical adenopathy.  Skin:    General: Skin is warm.     Findings: No rash.  Neurological:     Mental Status: He is alert and oriented to person, place, and time.     Cranial Nerves: No cranial nerve deficit.     Deep Tendon Reflexes: Reflexes are normal and symmetric.     Wt Readings from Last 3 Encounters:  04/03/19 248 lb (112.5 kg)  02/02/19 261 lb (118.4 kg)  04/29/17 249 lb 3.2 oz (113 kg)    BP 130/70   Pulse 72   Ht 6\' 1"  (1.854 m)   Wt 248 lb (112.5 kg)   BMI 32.72 kg/m   Assessment and Plan:  1. Esophageal dysphagia Vernard Gambles is having some discomfort which is been recurring.  Patient has previously had endoscopy with Dr. Verl Blalock and this is starting to resume symptomatology.  Will refer to gastroenterology for further evaluation and until then we will initiate pantoprazole 40 mg once a day - Ambulatory referral to Gastroenterology - pantoprazole (PROTONIX) 40 MG tablet; Take 1 tablet (40 mg total) by mouth daily.  Dispense: 30 tablet; Refill: 3  2. Delayed gastric emptying Patient is describing what sounds like gastroparesis or a delay in his gastric emptying with with retained food for several hours and occasionally will regurgitate.  Concerned about emptying of the stomach either due to digestive motion and certainly think that probably needs to  be evaluated for any pyloric obstructive concern. - Ambulatory referral to Gastroenterology - pantoprazole (PROTONIX) 40 MG tablet; Take 1 tablet (40 mg total) by mouth daily.  Dispense: 30 tablet; Refill: 3

## 2019-04-06 ENCOUNTER — Ambulatory Visit: Payer: BC Managed Care – PPO | Admitting: Family Medicine

## 2019-05-11 ENCOUNTER — Other Ambulatory Visit: Payer: Self-pay

## 2019-05-11 ENCOUNTER — Encounter: Payer: Self-pay | Admitting: Gastroenterology

## 2019-05-11 ENCOUNTER — Ambulatory Visit (INDEPENDENT_AMBULATORY_CARE_PROVIDER_SITE_OTHER): Payer: BC Managed Care – PPO | Admitting: Gastroenterology

## 2019-05-11 VITALS — BP 146/97 | HR 91 | Temp 97.0°F | Ht 73.0 in | Wt 258.2 lb

## 2019-05-11 DIAGNOSIS — K219 Gastro-esophageal reflux disease without esophagitis: Secondary | ICD-10-CM | POA: Diagnosis not present

## 2019-05-11 NOTE — Progress Notes (Signed)
Primary Care Physician: Juline Patch, MD  Primary Gastroenterologist:  Dr. Lucilla Lame  Chief Complaint  Patient presents with  . Dysphagia    HPI: Randall Bell is a 63 y.o. male here with a report of regurgitation.  The patient had a colonoscopy by me in 2018 and now comes with a report of heartburn that was greatly improved with Protonix that he was given by Dr. Ronnald Ramp.  The patient now states that about 3 times a week he is having symptoms of food coming all the way up into his mouth.  He reports it to be worse when he eats too much.  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  The patient denies any black stools or bloody stools.  There is also no overt vomiting.  He states that he had an upper endoscopy many years ago and was told that he had a yeast infection in his esophagus.  He has not had a repeat endoscopy since then which was about 25 years ago.  Current Outpatient Medications  Medication Sig Dispense Refill  . acetaminophen (TYLENOL) 500 MG tablet Take 2 tablets (1,000 mg total) by mouth every 8 (eight) hours as needed. 30 tablet 0  . Cholecalciferol (VITAMIN D3) 3000 units TABS Take 1 tablet by mouth daily. 30 tablet   . losartan (COZAAR) 100 MG tablet Take 1 tablet (100 mg total) by mouth daily. 90 tablet 1  . meloxicam (MOBIC) 15 MG tablet Take 1 tablet (15 mg total) by mouth daily. 30 tablet 0  . Multiple Vitamins-Minerals (CENTRUM SILVER ADULT 50+ PO) Take 1 tablet by mouth daily.    . pantoprazole (PROTONIX) 40 MG tablet Take 1 tablet (40 mg total) by mouth daily. 30 tablet 3  . tiZANidine (ZANAFLEX) 4 MG tablet Take 1 tablet by mouth at bedtime. [place]     No current facility-administered medications for this visit.     Allergies as of 05/11/2019 - Review Complete 05/11/2019  Allergen Reaction Noted  . Lisinopril Other (See Comments) 08/01/2015  . Flomax [tamsulosin hcl] Other  (See Comments) 08/01/2015    ROS:  General: Negative for anorexia, weight loss, fever, chills, fatigue, weakness. ENT: Negative for hoarseness, difficulty swallowing , nasal congestion. CV: Negative for chest pain, angina, palpitations, dyspnea on exertion, peripheral edema.  Respiratory: Negative for dyspnea at rest, dyspnea on exertion, cough, sputum, wheezing.  GI: See history of present illness. GU:  Negative for dysuria, hematuria, urinary incontinence, urinary frequency, nocturnal urination.  Endo: Negative for unusual weight change.    Physical Examination:   BP (!) 146/97   Pulse 91   Temp (!) 97 F (36.1 C) (Temporal)   Ht 6\' 1"  (1.854 m)   Wt 258 lb 3.2 oz (117.1 kg)   BMI 34.07 kg/m   General: Well-nourished, well-developed in no acute distress.  Eyes: No icterus. Conjunctivae pink. Mouth: Oropharyngeal mucosa moist and pink , no lesions erythema or exudate. Lungs: Clear to auscultation bilaterally. Non-labored. Heart: Regular rate and rhythm, no murmurs rubs or gallops.  Abdomen: Bowel sounds are normal, nontender, nondistended, no hepatosplenomegaly or masses, no abdominal bruits or hernia , no rebound or guarding.   Extremities: No lower extremity edema. No clubbing or deformities. Neuro: Alert and oriented x 3.  Grossly intact. Skin: Warm and dry, no jaundice.   Psych: Alert and cooperative, normal mood and affect.  Labs:  Imaging Studies: No results found.  Assessment and Plan:   VISENTE Bell is a 63 y.o. y/o male who comes in today with regurgitation and reflux.  The patient reports that the Protonix is helping him and he has been encouraged to stay on the Protonix.  The patient will also be set up for an EGD because of his history of a yeast infection in his esophagus and to rule out a hiatal hernia.  The patient has been told that the only sure way to stop regurgitation is with antireflux surgery but his symptoms can be helped with lifestyle  modifications including not eating for 4 hours before he goes to sleep.  Is also been told to eat more healthy and smaller meals multiple times a day instead of large meals.  The patient will be set up for an EGD. I have discussed risks & benefits which include, but are not limited to, bleeding, infection, perforation & drug reaction.  The patient agrees with this plan & written consent will be obtained.       Lucilla Lame, MD. Marval Regal   Note: This dictation was prepared with Dragon dictation along with smaller phrase technology. Any transcriptional errors that result from this process are unintentional.

## 2019-05-15 ENCOUNTER — Other Ambulatory Visit: Payer: Self-pay

## 2019-05-15 ENCOUNTER — Telehealth: Payer: Self-pay

## 2019-05-15 DIAGNOSIS — K219 Gastro-esophageal reflux disease without esophagitis: Secondary | ICD-10-CM

## 2019-05-15 NOTE — Telephone Encounter (Signed)
Pt called with "want to go see vein and vascular for this place in my neck." I spoke to Dr Ronnald Ramp and Ginger at [facility] concerning this pt. I did notice he was supposed to have an EGD with Dr Allen Norris- Ginger stated he was supposed to call them with a good time to scope. Pt stated that he "called yesterday and told them I can do it on the 26th." I explained to pt that she has probably not had time to listen to her voicemail and even still that is not enough time to give them to set up a procedure. He stated that "nobody can feel this in my neck but me, so there is no need to come in to see Dr Ronnald Ramp." He also said that he has "been googling this and it is a clot in my neck and I don't have time to be going here and there, I'll be dead." He has decided to just go to the ER for "a CT or MRI." I have called Ginger and notified her of his decision as well.

## 2019-05-22 DIAGNOSIS — G4733 Obstructive sleep apnea (adult) (pediatric): Secondary | ICD-10-CM | POA: Diagnosis not present

## 2019-05-22 DIAGNOSIS — E039 Hypothyroidism, unspecified: Secondary | ICD-10-CM | POA: Diagnosis not present

## 2019-05-22 DIAGNOSIS — R1310 Dysphagia, unspecified: Secondary | ICD-10-CM | POA: Diagnosis not present

## 2019-05-22 DIAGNOSIS — K219 Gastro-esophageal reflux disease without esophagitis: Secondary | ICD-10-CM | POA: Diagnosis not present

## 2019-06-08 ENCOUNTER — Telehealth: Payer: Self-pay | Admitting: Gastroenterology

## 2019-06-08 ENCOUNTER — Other Ambulatory Visit: Payer: Self-pay

## 2019-06-08 DIAGNOSIS — Z20822 Contact with and (suspected) exposure to covid-19: Secondary | ICD-10-CM

## 2019-06-08 NOTE — Telephone Encounter (Signed)
Received phone message patient request to cancel colonoscopy with Dr. Allen Norris on 06/15/19.  Patient was scheduled for EGD not colonoscopy with Dr. Allen Norris.  Notified [facility] of patients request to cancel his EGD.  LVM with patient to make him aware that his message was received and procedure has been canceled.  Thanks [employer]

## 2019-06-08 NOTE — Telephone Encounter (Signed)
Patient wanted to cx colonoscopy on 06-15-19 for DR [facility]. He will call & r/s in February or March 2021.

## 2019-06-10 LAB — NOVEL CORONAVIRUS, NAA: SARS-CoV-2, NAA: NOT DETECTED

## 2019-06-15 ENCOUNTER — Encounter: Admission: RE | Payer: Self-pay | Source: Home / Self Care

## 2019-06-15 ENCOUNTER — Ambulatory Visit
Admission: RE | Admit: 2019-06-15 | Payer: BC Managed Care – PPO | Source: Home / Self Care | Admitting: Gastroenterology

## 2019-06-15 SURGERY — ESOPHAGOGASTRODUODENOSCOPY (EGD) WITH PROPOFOL
Anesthesia: Choice

## 2019-08-17 DIAGNOSIS — R200 Anesthesia of skin: Secondary | ICD-10-CM | POA: Diagnosis not present

## 2019-10-05 DIAGNOSIS — G5602 Carpal tunnel syndrome, left upper limb: Secondary | ICD-10-CM | POA: Diagnosis not present

## 2019-10-05 DIAGNOSIS — G5601 Carpal tunnel syndrome, right upper limb: Secondary | ICD-10-CM | POA: Diagnosis not present

## 2019-10-27 ENCOUNTER — Other Ambulatory Visit: Payer: Self-pay | Admitting: Orthopedic Surgery

## 2019-10-27 DIAGNOSIS — M4802 Spinal stenosis, cervical region: Secondary | ICD-10-CM

## 2019-10-27 DIAGNOSIS — M5412 Radiculopathy, cervical region: Secondary | ICD-10-CM

## 2019-11-06 ENCOUNTER — Ambulatory Visit: Payer: BC Managed Care – PPO

## 2019-11-09 DIAGNOSIS — R200 Anesthesia of skin: Secondary | ICD-10-CM | POA: Diagnosis not present

## 2019-11-09 DIAGNOSIS — Z135 Encounter for screening for eye and ear disorders: Secondary | ICD-10-CM | POA: Diagnosis not present

## 2019-11-09 DIAGNOSIS — R202 Paresthesia of skin: Secondary | ICD-10-CM | POA: Diagnosis not present

## 2019-11-09 DIAGNOSIS — M50222 Other cervical disc displacement at C5-C6 level: Secondary | ICD-10-CM | POA: Diagnosis not present

## 2019-11-09 DIAGNOSIS — M47812 Spondylosis without myelopathy or radiculopathy, cervical region: Secondary | ICD-10-CM | POA: Diagnosis not present

## 2020-01-02 DIAGNOSIS — R9431 Abnormal electrocardiogram [ECG] [EKG]: Secondary | ICD-10-CM | POA: Diagnosis not present

## 2020-01-02 DIAGNOSIS — Z87891 Personal history of nicotine dependence: Secondary | ICD-10-CM | POA: Diagnosis not present

## 2020-01-02 DIAGNOSIS — H9313 Tinnitus, bilateral: Secondary | ICD-10-CM | POA: Diagnosis not present

## 2020-01-02 DIAGNOSIS — Z79899 Other long term (current) drug therapy: Secondary | ICD-10-CM | POA: Diagnosis not present

## 2020-01-02 DIAGNOSIS — Z6832 Body mass index (BMI) 32.0-32.9, adult: Secondary | ICD-10-CM | POA: Diagnosis not present

## 2020-01-02 DIAGNOSIS — Z8249 Family history of ischemic heart disease and other diseases of the circulatory system: Secondary | ICD-10-CM | POA: Diagnosis not present

## 2020-01-02 DIAGNOSIS — I10 Essential (primary) hypertension: Secondary | ICD-10-CM | POA: Diagnosis not present

## 2020-01-02 DIAGNOSIS — R0600 Dyspnea, unspecified: Secondary | ICD-10-CM | POA: Diagnosis not present

## 2020-01-02 DIAGNOSIS — R918 Other nonspecific abnormal finding of lung field: Secondary | ICD-10-CM | POA: Diagnosis not present

## 2020-01-03 DIAGNOSIS — I10 Essential (primary) hypertension: Secondary | ICD-10-CM | POA: Diagnosis not present

## 2020-01-04 ENCOUNTER — Ambulatory Visit (INDEPENDENT_AMBULATORY_CARE_PROVIDER_SITE_OTHER): Payer: BC Managed Care – PPO | Admitting: Family Medicine

## 2020-01-04 ENCOUNTER — Other Ambulatory Visit: Payer: Self-pay

## 2020-01-04 ENCOUNTER — Encounter: Payer: Self-pay | Admitting: Family Medicine

## 2020-01-04 VITALS — BP 164/80 | HR 90 | Temp 97.2°F | Ht 73.0 in | Wt 246.0 lb

## 2020-01-04 DIAGNOSIS — I10 Essential (primary) hypertension: Secondary | ICD-10-CM

## 2020-01-04 DIAGNOSIS — R9389 Abnormal findings on diagnostic imaging of other specified body structures: Secondary | ICD-10-CM | POA: Diagnosis not present

## 2020-01-04 MED ORDER — HYDROCHLOROTHIAZIDE 12.5 MG PO TABS: 12.5000 mg | ORAL_TABLET | Freq: Every day | ORAL | 0 refills | Status: DC

## 2020-01-04 NOTE — Progress Notes (Signed)
Date:  01/04/2020   Name:  Randall Bell   DOB:  10/27/1955   MRN:  [id number]   Chief Complaint: Hypertension (164/80)  Hypertension This is a chronic problem. The current episode started more than 1 year ago. The problem has been gradually improving since onset. The problem is uncontrolled. Pertinent negatives include no anxiety, blurred vision, chest pain, headaches, malaise/fatigue, neck pain, orthopnea, palpitations, peripheral edema, PND, shortness of breath or sweats.    Lab Results  Component Value Date   CREATININE 1.14 09/07/2016   BUN 20 09/07/2016   NA 140 09/07/2016   K 4.3 09/07/2016   CL 100 09/07/2016   CO2 22 09/07/2016   Lab Results  Component Value Date   CHOL 216 (H) 08/01/2015   HDL 65 08/01/2015   LDLCALC 132 (H) 08/01/2015   TRIG 95 08/01/2015   CHOLHDL 3.3 08/01/2015   Lab Results  Component Value Date   TSH 1.230 08/01/2015   No results found for: HGBA1C Lab Results  Component Value Date   WBC 4.7 04/09/2016   HGB 15.7 04/09/2016   HCT 44.5 04/09/2016   MCV 90.8 04/09/2016   PLT 179 04/09/2016   Lab Results  Component Value Date   ALT 56 (H) 09/07/2016   AST 40 09/07/2016   ALKPHOS 51 09/07/2016   BILITOT 0.4 09/07/2016     Review of Systems  Constitutional: Negative for malaise/fatigue.  Eyes: Negative for blurred vision.  Respiratory: Negative for shortness of breath.   Cardiovascular: Negative for chest pain, palpitations, orthopnea and PND.  Musculoskeletal: Negative for neck pain.  Neurological: Negative for headaches.    Patient Active Problem List   Diagnosis Date Noted  . Sleep apnea 03/11/2017  . Personal history of colonic polyps   . Benign neoplasm of descending colon   . Cellulitis of left hand 09/12/2016  . Obesity (BMI 30.0-34.9) 09/07/2016  . GERD (gastroesophageal reflux disease) 09/07/2016  . Vitamin D deficiency 08/02/2015  . Elevated liver enzymes 08/02/2015  . Hypertension 08/01/2015  . Benign  prostatic hyperplasia 08/01/2015  . Genital warts 08/01/2015  . Paresthesia and pain of both upper extremities 08/01/2015  . Neck pain of over 3 months duration 08/01/2015  . FH: heart disease 08/01/2015  . FH: alpha 1 antitrypsin deficiency 08/01/2015  . Colon polyps 08/01/2015    Allergies  Allergen Reactions  . Lisinopril Other (See Comments)    Dizziness and arthralgias  . Flomax [Tamsulosin Hcl] Other (See Comments)    Sexual dysfunction    Past Surgical History:  Procedure Laterality Date  . COLONOSCOPY  2011   repeat in 5 yrs/ polyps- [employer]  . COLONOSCOPY WITH PROPOFOL N/A 12/28/2016   Procedure: COLONOSCOPY WITH PROPOFOL;  Surgeon: Lucilla Lame, MD;  Location: [place];  Service: Endoscopy;  Laterality: N/A;  . HERNIA REPAIR    . POLYPECTOMY  12/28/2016   Procedure: POLYPECTOMY;  Surgeon: Lucilla Lame, MD;  Location: [place];  Service: Endoscopy;;    Social History   Tobacco Use  . Smoking status: Former Smoker    Types: Cigarettes    Quit date: 08/20/2014    Years since quitting: 5.3  . Smokeless tobacco: Never Used  Substance Use Topics  . Alcohol use: Yes    Alcohol/week: 12.0 standard drinks    Types: 12 Cans of beer per week    Comment:    . Drug use: No     Medication list has been reviewed and updated.  Current Meds  Medication Sig  . acetaminophen (TYLENOL) 500 MG tablet Take 2 tablets (1,000 mg total) by mouth every 8 (eight) hours as needed.  . Cholecalciferol (VITAMIN D3) 3000 units TABS Take 1 tablet by mouth daily.  Marland Kitchen losartan (COZAAR) 100 MG tablet Take 1 tablet (100 mg total) by mouth daily.  . meloxicam (MOBIC) 15 MG tablet Take 1 tablet (15 mg total) by mouth daily.  . Multiple Vitamins-Minerals (CENTRUM SILVER ADULT 50+ PO) Take 1 tablet by mouth daily.  . pantoprazole (PROTONIX) 40 MG tablet Take 1 tablet (40 mg total) by mouth daily.  Marland Kitchen tiZANidine (ZANAFLEX) 4 MG tablet Take 1 tablet by mouth at bedtime. Mundy    . traMADol (ULTRAM) 50 MG tablet Take by mouth every 6 (six) hours as needed.    PHQ 2/9 Scores 01/04/2020 04/03/2019 02/02/2019 08/01/2015  PHQ - 2 Score 1 0 0 0  PHQ- 9 Score 6 0 0 -    BP Readings from Last 3 Encounters:  01/04/20 (!) 164/80  05/11/19 (!) 146/97  04/03/19 130/70    Physical Exam  Wt Readings from Last 3 Encounters:  01/04/20 246 lb (111.6 kg)  05/11/19 258 lb 3.2 oz (117.1 kg)  04/03/19 248 lb (112.5 kg)    BP (!) 164/80   Pulse 90   Temp (!) 97.2 F (36.2 C) (Temporal)   Ht 6\' 1"  (1.854 m)   Wt 246 lb (111.6 kg)   SpO2 98%   BMI 32.46 kg/m   Assessment and Plan:                                    The model of the story is that this is what happens when a over 34 year old decides to party at the level of about two 65 year old. 1. Essential hypertension Blood pressure was noted to be significantly elevated upon the patient developing tendinitis and generally not feeling well after multiple bloody Mary's, other alcohol, and [place] food.  Patient was evaluated and this was reviewed and [facility] emergency room.  Patient's blood pressure was noted to be returning to normal upon discharge.  It was decided that we would add a low-dose hydrochlorothiazide to the patient's current lisinopril dosage.  We will recheck patient pressure with a physical exam in about 4 weeks.  In the meantime patient has been instructed to be look more careful with alcohol intake as well as sodium load. 2. Abnormal chest x-ray On review of the patient's chart at [facility] it was noted that there was an over read of the radiologic report that noted an opacity in the right lower lobe.  Exam notes that there is breath sounds in these areas.  We will repeat a chest x-ray at the time of his recheck in 4 weeks at which time we will do a physical exam and follow-up of blood pressure.

## 2020-01-05 ENCOUNTER — Other Ambulatory Visit: Payer: Self-pay | Admitting: Family Medicine

## 2020-01-05 DIAGNOSIS — I10 Essential (primary) hypertension: Secondary | ICD-10-CM

## 2020-01-05 NOTE — Telephone Encounter (Signed)
Pt. Will need new prescription next month.

## 2020-01-20 ENCOUNTER — Telehealth: Payer: Self-pay | Admitting: Family Medicine

## 2020-01-20 NOTE — Telephone Encounter (Unsigned)
Copied from [place] [id number]. Topic: General - Other >> Jan 20, 2020  1:54 PM Alanda Slim E wrote: Reason for CRM: Pt called to see when his chest xray was scheduled for/ please advise about chest xray

## 2020-01-21 NOTE — Telephone Encounter (Signed)
Called and left a message.

## 2020-01-21 NOTE — Telephone Encounter (Signed)
Please call pt and tell him he was supposed to schedule appt for b/p follow up and chest xray ordering around 6/17. Please call and schedule

## 2020-01-28 ENCOUNTER — Telehealth: Payer: Self-pay | Admitting: Family Medicine

## 2020-01-28 NOTE — Telephone Encounter (Signed)
Please try to call him again and schedule a follow up appt b/p check and chest xray repeat. He has to have office visit, not just xray

## 2020-01-28 NOTE — Telephone Encounter (Signed)
Copied from [place] [id number]. Topic: Appointment Scheduling - Scheduling Inquiry for Clinic >> Jan 28, 2020  1:14 PM Alease Frame wrote: Reason for CRM: Pt is wanting to setup an appt for a lung Xray possible next Tuesday [phone] around 11:30am . Please reach out to pt regarding appt .

## 2020-02-01 NOTE — Telephone Encounter (Signed)
I spoke to the patient again, after speaking to Dr Ronnald Ramp and looking at the documentation on the 5/17 note. He was supposed to follow up for b/p check and listen to breath sounds, then an xray would be ordered in 4 weeks. I explained this to patient and he stated we are only trying to get money out of him. I went over the fact that his past two readings have been high and we need a good reading on file for him and she needs to listen to his lungs before ordering an xray. He stated he will be calling another physician and if he can't get them to order it, then he will call and make an appt.

## 2020-02-01 NOTE — Telephone Encounter (Signed)
Pt stated his bp is good so does not need to be seen and also just wants an order for chest xray to be put in.

## 2020-02-02 NOTE — Telephone Encounter (Signed)
No, he needs office visit where we can check it and then Dr Ronnald Ramp will order labs and the xray he is wanting

## 2020-02-02 NOTE — Telephone Encounter (Signed)
Patient states his employer offers wellness checks thru [employer] and would like to know if he can provide the report which will reflect his BP reading, please advise

## 2020-02-03 ENCOUNTER — Ambulatory Visit: Payer: BC Managed Care – PPO

## 2020-02-03 ENCOUNTER — Other Ambulatory Visit: Payer: Self-pay

## 2020-02-03 ENCOUNTER — Telehealth: Payer: Self-pay

## 2020-02-03 DIAGNOSIS — R9389 Abnormal findings on diagnostic imaging of other specified body structures: Secondary | ICD-10-CM

## 2020-02-03 NOTE — Telephone Encounter (Signed)
I spoke with pt again today about the reasoning behind needing to see him to recheck b/p and order chest xray. Pt is argumentative stating that "You are just trying to take money from me." I explained that neither Dr Ronnald Ramp nor myself sees his "money." He said he is "Going to see another primary care at [facility] because quite frankly she creeps me out." I explained that we weren't going to put down Dr Ronnald Ramp, we should be using this call to explain why he needed to come in. Dr Ronnald Ramp changed his medicine on 5/17 and needed to do follow up for recheck b/p as well as labs. He has stated that "[facility] doctors and nurses have told me she can order it without seeing me. I asked for the telephone number of the doctor or nurses that he is talking to, to see why they were telling pt this. He would not give me the number. I told him we would place the xray order and he needs to keep his appointment at [facility] that he "already has scheduled." Xray order placed.

## 2020-02-03 NOTE — Progress Notes (Unsigned)
Xray entered.

## 2020-02-05 DIAGNOSIS — Z713 Dietary counseling and surveillance: Secondary | ICD-10-CM | POA: Diagnosis not present

## 2020-02-09 ENCOUNTER — Other Ambulatory Visit: Payer: Self-pay

## 2020-02-09 ENCOUNTER — Ambulatory Visit
Admission: RE | Admit: 2020-02-09 | Discharge: 2020-02-09 | Disposition: A | Discharge status: Home / Self Care | Payer: BC Managed Care – PPO | Attending: Family Medicine | Admitting: Family Medicine

## 2020-02-09 ENCOUNTER — Ambulatory Visit
Admission: RE | Admit: 2020-02-09 | Discharge: 2020-02-09 | Disposition: A | Discharge status: Home / Self Care | Payer: BC Managed Care – PPO | Source: Ambulatory Visit | Attending: Family Medicine | Admitting: Family Medicine

## 2020-02-09 DIAGNOSIS — R9389 Abnormal findings on diagnostic imaging of other specified body structures: Secondary | ICD-10-CM | POA: Diagnosis not present

## 2020-02-09 DIAGNOSIS — R918 Other nonspecific abnormal finding of lung field: Secondary | ICD-10-CM | POA: Diagnosis not present

## 2020-02-15 ENCOUNTER — Other Ambulatory Visit: Payer: Self-pay | Admitting: Family Medicine

## 2020-02-15 DIAGNOSIS — K30 Functional dyspepsia: Secondary | ICD-10-CM

## 2020-02-15 DIAGNOSIS — R1319 Other dysphagia: Secondary | ICD-10-CM

## 2020-02-15 NOTE — Telephone Encounter (Signed)
Requested medication (s) are due for refill today:   Yes  Requested medication (s) are on the active medication list:   Yes  Future visit scheduled:   No   Last ordered: 04/03/2019 #30  3 refills  Clinic note:   Returned to let Dr. Ronnald Ramp decide whether she wants to refill this Rx or not after seeing notes in chart.   Requested Prescriptions  Pending Prescriptions Disp Refills   pantoprazole (PROTONIX) 40 MG tablet [Pharmacy Med Name: Pantoprazole Sodium 40 MG Oral Tablet Delayed Release] 30 tablet 0    Sig: Take 1 tablet by mouth once daily      Gastroenterology: Proton Pump Inhibitors Passed - 02/15/2020 10:00 AM      Passed - Valid encounter within last 12 months    Recent Outpatient Visits           1 month ago Essential hypertension   [place], MD   10 months ago Esophageal dysphagia   [facility] Juline Patch, MD   1 year ago Establishing care with new doctor, encounter for   [place], MD   2 years ago Left lower quadrant pain   [facility] [place], Gwyndolyn Saxon, MD   3 years ago Cellulitis of left index finger   [facility] Adline Potter, MD

## 2020-04-11 DIAGNOSIS — K219 Gastro-esophageal reflux disease without esophagitis: Secondary | ICD-10-CM | POA: Diagnosis not present

## 2020-04-11 DIAGNOSIS — Z681 Body mass index (BMI) 19 or less, adult: Secondary | ICD-10-CM | POA: Diagnosis not present

## 2020-04-11 DIAGNOSIS — Z0001 Encounter for general adult medical examination with abnormal findings: Secondary | ICD-10-CM | POA: Diagnosis not present

## 2020-04-11 DIAGNOSIS — I10 Essential (primary) hypertension: Secondary | ICD-10-CM | POA: Diagnosis not present

## 2020-06-20 DIAGNOSIS — Z125 Encounter for screening for malignant neoplasm of prostate: Secondary | ICD-10-CM | POA: Diagnosis not present

## 2020-06-20 DIAGNOSIS — K219 Gastro-esophageal reflux disease without esophagitis: Secondary | ICD-10-CM | POA: Diagnosis not present

## 2020-06-20 DIAGNOSIS — I10 Essential (primary) hypertension: Secondary | ICD-10-CM | POA: Diagnosis not present

## 2020-06-20 DIAGNOSIS — Z0001 Encounter for general adult medical examination with abnormal findings: Secondary | ICD-10-CM | POA: Diagnosis not present

## 2020-09-04 ENCOUNTER — Encounter: Payer: Self-pay | Admitting: Family Medicine

## 2020-09-13 ENCOUNTER — Encounter: Payer: Self-pay | Admitting: Family Medicine

## 2022-10-07 ENCOUNTER — Encounter: Payer: Self-pay | Admitting: Emergency Medicine

## 2022-10-07 ENCOUNTER — Ambulatory Visit
Admission: EM | Admit: 2022-10-07 | Discharge: 2022-10-07 | Disposition: A | Discharge status: Home / Self Care | Payer: Medicare Other | Attending: Family Medicine | Admitting: Family Medicine

## 2022-10-07 DIAGNOSIS — R220 Localized swelling, mass and lump, head: Secondary | ICD-10-CM | POA: Diagnosis not present

## 2022-10-07 MED ORDER — AMOXICILLIN-POT CLAVULANATE 875-125 MG PO TABS: 1.0000 | ORAL_TABLET | Freq: Two times a day (BID) | ORAL | 0 refills | Status: DC

## 2022-10-07 NOTE — ED Triage Notes (Signed)
Patient states that he has had chills and white patch under his tongue for a week.  Patient denies fevers.

## 2022-10-07 NOTE — Discharge Instructions (Signed)
Your exam is not consistent with Ludwig's angina.  I am concerned about the growth.  Antibiotic as prescribed.  Please see your dentist in the near future.  You may need a biopsy of this area.  If you worsen or develop fever I have difficulty swallowing, etc. please go directly to the hospital.

## 2022-10-07 NOTE — ED Provider Notes (Signed)
[facility]    CSN: [id number] Arrival date & time: 10/07/22  1129      History   Chief Complaint Chief Complaint  Patient presents with   Chills    HPI  67 year old male presents for evaluation of the above.  Patient is a former smoker.  He reports that over the past week he has had chills.  No fever.  He reports an abnormal white patch underneath his tongue.  He is concerned about this.  He went on the Internet and is concerned that he may have Ludwigs angina. No fever. No other associated symptoms.  Past Medical History:  Diagnosis Date   Arthritis    neck, hands   Bilateral hand numbness    pt thinks it may be Carpal Tunnel   GERD (gastroesophageal reflux disease)    Hypertension    Wears dentures    partial upper    Patient Active Problem List   Diagnosis Date Noted   Sleep apnea 03/11/2017   Personal history of colonic polyps    Benign neoplasm of descending colon    Cellulitis of left hand 09/12/2016   Obesity (BMI 30.0-34.9) 09/07/2016   GERD (gastroesophageal reflux disease) 09/07/2016   Vitamin D deficiency 08/02/2015   Elevated liver enzymes 08/02/2015   Hypertension 08/01/2015   Benign prostatic hyperplasia 08/01/2015   Genital warts 08/01/2015   Paresthesia and pain of both upper extremities 08/01/2015   Neck pain of over 3 months duration 08/01/2015   FH: heart disease 08/01/2015   FH: alpha 1 antitrypsin deficiency 08/01/2015   Colon polyps 08/01/2015    Past Surgical History:  Procedure Laterality Date   COLONOSCOPY  2011   repeat in 5 yrs/ polyps- [facility] docs   COLONOSCOPY WITH PROPOFOL N/A 12/28/2016   Procedure: COLONOSCOPY WITH PROPOFOL;  Surgeon: Lucilla Lame, MD;  Location: [place];  Service: Endoscopy;  Laterality: N/A;   HERNIA REPAIR     POLYPECTOMY  12/28/2016   Procedure: POLYPECTOMY;  Surgeon: Lucilla Lame, MD;  Location: [place];  Service: Endoscopy;;       Home Medications    Prior to  Admission medications   Medication Sig Start Date End Date Taking? Authorizing Provider  amoxicillin-clavulanate (AUGMENTIN) 875-125 MG tablet Take 1 tablet by mouth every 12 (twelve) hours. 10/07/22  Yes Aarya Quebedeaux G, DO  acetaminophen (TYLENOL) 500 MG tablet Take 2 tablets (1,000 mg total) by mouth every 8 (eight) hours as needed. 03/12/17   [place], Gwyndolyn Saxon, MD  Cholecalciferol (VITAMIN D3) 3000 units TABS Take 1 tablet by mouth daily. 09/07/16   [place], Gwyndolyn Saxon, MD  hydrochlorothiazide (HYDRODIURIL) 12.5 MG tablet Take 1 tablet (12.5 mg total) by mouth daily. 01/04/20   Juline Patch, MD  losartan (COZAAR) 100 MG tablet Take 1 tablet by mouth once daily 01/05/20   Juline Patch, MD  meloxicam (MOBIC) 15 MG tablet Take 1 tablet (15 mg total) by mouth daily. 02/02/19   Juline Patch, MD  Multiple Vitamins-Minerals (CENTRUM SILVER ADULT 50+ PO) Take 1 tablet by mouth daily.    [provider]  pantoprazole (PROTONIX) 40 MG tablet Take 1 tablet (40 mg total) by mouth daily. 04/03/19   Juline Patch, MD  tiZANidine (ZANAFLEX) 4 MG tablet Take 1 tablet by mouth at bedtime. Mundy 03/02/19   [provider]  traMADol (ULTRAM) 50 MG tablet Take by mouth every 6 (six) hours as needed.    [provider]    [facility]  History Family History  Problem Relation Age of Onset   Breast cancer Mother    Cancer Mother    Heart disease Father    Prostate cancer Father    Cancer Father    Diabetes Brother    Hypertension Brother    Kidney cancer Neg Hx    Bladder Cancer Neg Hx     Social History Social History   Tobacco Use   Smoking status: Former    Types: Cigarettes    Quit date: 08/20/2014    Years since quitting: 8.1   Smokeless tobacco: Never  Vaping Use   Vaping Use: Never used  Substance Use Topics   Alcohol use: Yes    Alcohol/week: 12.0 standard drinks of alcohol    Types: 12 Cans of beer per week    Comment:     Drug use: No     Allergies   Lisinopril and  Flomax [tamsulosin hcl]   Review of Systems Review of Systems Per HPI  Physical Exam Triage Vital Signs ED Triage Vitals  Enc Vitals Group     BP 10/07/22 1204 (!) 160/90     Pulse Rate 10/07/22 1204 74     Resp 10/07/22 1204 15     Temp 10/07/22 1204 98.3 F (36.8 C)     Temp Source 10/07/22 1204 Oral     SpO2 10/07/22 1204 96 %     Weight 10/07/22 1203 246 lb 0.5 oz (111.6 kg)     Height 10/07/22 1203 6' 1"$  (1.854 m)     Head Circumference --      Peak Flow --      Pain Score 10/07/22 1203 0     Pain Loc --      Pain Edu? --      Excl. in [place]? --    Updated Vital Signs BP (!) 160/90 (BP Location: Right Arm)   Pulse 74   Temp 98.3 F (36.8 C) (Oral)   Resp 15   Ht 6' 1"$  (1.854 m)   Wt 111.6 kg   SpO2 96%   BMI 32.46 kg/m   Visual Acuity Right Eye Distance:   Left Eye Distance:   Bilateral Distance:    Right Eye Near:   Left Eye Near:    Bilateral Near:     Physical Exam Vitals and nursing note reviewed.  Constitutional:      General: He is not in acute distress.    Appearance: Normal appearance.  HENT:     Head: Normocephalic and atraumatic.     Mouth/Throat:     Comments: There is an abnormal growth at the sublingual folds.  No appreciable purulence. Cardiovascular:     Rate and Rhythm: Normal rate and regular rhythm.  Pulmonary:     Effort: Pulmonary effort is normal. No respiratory distress.     Breath sounds: Normal breath sounds. No wheezing or rales.  Neurological:     Mental Status: He is alert.  Psychiatric:        Mood and Affect: Mood normal.        Behavior: Behavior normal.      UC Treatments / Results  Labs (all labs ordered are listed, but only abnormal results are displayed) Labs Reviewed - No data to display  EKG   Radiology No results found.  Procedures Procedures (including critical care time)  Medications Ordered in UC Medications - No data to display  Initial Impression / Assessment and Plan / UC Course  I  have reviewed the triage vital signs and the nursing notes.  Pertinent labs & imaging results that were available during my care of the patient were reviewed by me and considered in my medical decision making (see chart for details).    67 year old male presents with an abnormal growth underneath the tongue.  There is no evidence of Ludwig's angina.  He is well-appearing.  I am placing him empirically on Augmentin.  Advised to see his dentist.  He will likely need a biopsy of this area.  Concern for cancer given his prior history of tobacco abuse.  Final Clinical Impressions(s) / UC Diagnoses   Final diagnoses:  Swelling under tongue     Discharge Instructions      Your exam is not consistent with Ludwig's angina.  I am concerned about the growth.  Antibiotic as prescribed.  Please see your dentist in the near future.  You may need a biopsy of this area.  If you worsen or develop fever I have difficulty swallowing, etc. please go directly to the hospital.   ED Prescriptions     Medication Sig Dispense Auth. Provider   amoxicillin-clavulanate (AUGMENTIN) 875-125 MG tablet Take 1 tablet by mouth every 12 (twelve) hours. 14 tablet Coral Spikes, DO      PDMP not reviewed this encounter.   Coral Spikes, [place] 10/07/22 1259

## 2022-10-16 DIAGNOSIS — K7460 Unspecified cirrhosis of liver: Secondary | ICD-10-CM | POA: Insufficient documentation

## 2022-10-16 DIAGNOSIS — E8801 Alpha-1-antitrypsin deficiency: Secondary | ICD-10-CM | POA: Insufficient documentation

## 2023-06-03 ENCOUNTER — Ambulatory Visit (INDEPENDENT_AMBULATORY_CARE_PROVIDER_SITE_OTHER): Payer: Medicare Other | Admitting: Internal Medicine

## 2023-06-03 VITALS — BP 115/75 | HR 85 | Resp 14 | Ht 73.0 in | Wt 220.0 lb

## 2023-06-03 DIAGNOSIS — G4733 Obstructive sleep apnea (adult) (pediatric): Secondary | ICD-10-CM | POA: Insufficient documentation

## 2023-06-03 DIAGNOSIS — I10 Essential (primary) hypertension: Secondary | ICD-10-CM

## 2023-06-03 NOTE — Patient Instructions (Signed)

## 2023-06-03 NOTE — Progress Notes (Unsigned)
Sleep Medicine   Office Visit  Patient Name: Randall Bell DOB: 1956-06-14 MRN [id number]    Chief Complaint: history of OSA   Brief History:  Randall Bell presents for an initial sleep consultation with a 2 year history of severe OSA diagnosed by way of an HST 01/13/21.  He has had a 40 lb weight loss in the past year.  He was previously setup on CPAP and could not tolerate the high pressure.  It has been about 20 years since he used the PAP.  Sleep quality is poor. This is noted most nights. The patient's bed partner reports  loud snoring and witnessed apnea at night. The patient relates the following symptoms: loud snoring, gasping, difficulty getting and staying asleep, and excessive daytime sleepiness are also present. The patient does not have a consistent sleep schedule.  He will sleep for a few hours at a time.   Sleep quality is worse when outside home environment.  Patient has noted restlessness of his legs at night.  The patient  relates dream enactment and restless  behavior during the night.  The patient denies a history of psychiatric problems. The Epworth Sleepiness Score is 13 out of 24 .  The patient relates  Cardiovascular risk factors include: hypertension.     ROS  General: (-) fever, (-) chills, (-) night sweat Nose and Sinuses: (-) nasal stuffiness or itchiness, (-) postnasal drip, (-) nosebleeds, (-) sinus trouble. Mouth and Throat: (-) sore throat, (-) hoarseness. Neck: (-) swollen glands, (-) enlarged thyroid, (-) neck pain. Respiratory: - cough, - shortness of breath, - wheezing. Neurologic: - numbness, - tingling. Psychiatric: - anxiety, - depression Sleep behavior: -sleep paralysis -hypnogogic hallucinations +dream enactment      -vivid dreams -cataplexy -night terrors -sleep walking   Current Medication: Outpatient Encounter Medications as of 06/03/2023  Medication Sig Note   albuterol (VENTOLIN HFA) 108 (90 Base) MCG/ACT inhaler Inhale into the lungs.     atorvastatin (LIPITOR) 40 MG tablet Take 1 tablet by mouth daily.    gabapentin (NEURONTIN) 300 MG capsule Take by mouth.    hydrOXYzine (ATARAX) 25 MG tablet Take by mouth.    losartan-hydrochlorothiazide (HYZAAR) 100-12.5 MG tablet Take 1 tablet by mouth daily.    acetaminophen (TYLENOL) 500 MG tablet Take 2 tablets (1,000 mg total) by mouth every 8 (eight) hours as needed. 04/29/2017: PRN   Cholecalciferol (VITAMIN D3) 3000 units TABS Take 1 tablet by mouth daily.    Multiple Vitamins-Minerals (CENTRUM SILVER ADULT 50+ PO) Take 1 tablet by mouth daily. 08/01/2015: Received from: [facility] Received Sig: Take by mouth.   pantoprazole (PROTONIX) 40 MG tablet Take 1 tablet (40 mg total) by mouth daily.    tiZANidine (ZANAFLEX) 4 MG tablet Take 1 tablet by mouth at bedtime. Mundy    traMADol (ULTRAM) 50 MG tablet Take by mouth every 6 (six) hours as needed.    [DISCONTINUED] amoxicillin-clavulanate (AUGMENTIN) 875-125 MG tablet Take 1 tablet by mouth every 12 (twelve) hours.    [DISCONTINUED] hydrochlorothiazide (HYDRODIURIL) 12.5 MG tablet Take 1 tablet (12.5 mg total) by mouth daily.    [DISCONTINUED] losartan (COZAAR) 100 MG tablet Take 1 tablet by mouth once daily    [DISCONTINUED] meloxicam (MOBIC) 15 MG tablet Take 1 tablet (15 mg total) by mouth daily.    No facility-administered encounter medications on file as of 06/03/2023.    Surgical History: Past Surgical History:  Procedure Laterality Date   COLONOSCOPY  2011   repeat in  5 yrs/ polyps- [facility] docs   COLONOSCOPY WITH PROPOFOL N/A 12/28/2016   Procedure: COLONOSCOPY WITH PROPOFOL;  Surgeon: Midge Minium, MD;  Location: [facility];  Service: Endoscopy;  Laterality: N/A;   HERNIA REPAIR     POLYPECTOMY  12/28/2016   Procedure: POLYPECTOMY;  Surgeon: Midge Minium, MD;  Location: [facility];  Service: Endoscopy;;    Medical History: Past Medical History:  Diagnosis Date   Arthritis    neck, hands   Bilateral  hand numbness    pt thinks it may be Carpal Tunnel   GERD (gastroesophageal reflux disease)    Hypertension    Wears dentures    partial upper    Family History: Non contributory to the present illness  Social History: Social History   Socioeconomic History   Marital status: Single    Spouse name: Not on file   Number of children: Not on file   Years of education: Not on file   Highest education level: Not on file  Occupational History   Not on file  Tobacco Use   Smoking status: Former    Current packs/day: 0.00    Types: Cigarettes    Quit date: 08/20/2014    Years since quitting: 8.7   Smokeless tobacco: Never  Vaping Use   Vaping status: Never Used  Substance and Sexual Activity   Alcohol use: Yes    Alcohol/week: 12.0 standard drinks of alcohol    Types: 12 Cans of beer per week    Comment:     Drug use: No   Sexual activity: Not Currently  Other Topics Concern   Not on file  Social History Narrative   Not on file   Social Determinants of Health   Financial Resource Strain: Low Risk  (12/14/2021)   Received from [facility], [facility]   Overall Financial Resource Strain (CARDIA)    Difficulty of Paying Living Expenses: Not hard at all  Food Insecurity: No Food Insecurity (12/14/2021)   Received from [facility], [facility]   Hunger Vital Sign    Worried About Running Out of Food in the Last Year: Never true    Ran Out of Food in the Last Year: Never true  Transportation Needs: No Transportation Needs (12/14/2021)   [facility], [facility]   [facility] - Transportation    Lack of Transportation (Medical): No    Lack of Transportation (Non-Medical): No  Physical Activity: Sufficiently Active (07/28/2021)   Received from [facility], [facility]   Exercise Vital Sign    Days of Exercise per Week: 7 days    Minutes of Exercise per Session: 30 min  Stress: Not on file  Social Connections: Unknown (01/02/2022)    Received from [facility], [facility]   Social Network    Social Network: Not on file  Intimate Partner Violence: Unknown (11/24/2021)   Received from [facility], [facility]   HITS    Physically Hurt: Not on file    Insult or Talk Down To: Not on file    Threaten Physical Harm: Not on file    Scream or Curse: Not on file    Vital Signs: Blood pressure 115/75, pulse 85, resp. rate 14, height 6\' 1"  (1.854 m), weight 220 lb (99.8 kg), SpO2 97%. Body mass index is 29.03 kg/m.   Examination: General Appearance: The patient is well-developed, well-nourished, and in no distress. Neck Circumference: 43 cm Skin:  Gross inspection of skin unremarkable. Head: normocephalic, no gross deformities. Eyes: no gross deformities noted. ENT: ears appear grossly normal Neurologic: Alert and oriented. No involuntary movements.    STOP BANG RISK ASSESSMENT S (snore) Have you been told that you snore?     YES   T (tired) Are you often tired, fatigued, or sleepy during the day?   YES  O (obstruction) Do you stop breathing, choke, or gasp during sleep? YES   P (pressure) Do you have or are you being treated for high blood pressure? YES   B (BMI) Is your body index greater than 35 kg/m? NO   A (age) Are you 85 years old or older? YES   N (neck) Do you have a neck circumference greater than 16 inches?   YES   G (gender) Are you a male? YES   TOTAL STOP/BANG "YES" ANSWERS 7                                                               A STOP-Bang score of 2 or less is considered low risk, and a score of 5 or more is high risk for having either moderate or severe OSA. For people who score 3 or 4, doctors may need to perform further assessment to determine how likely they are to have OSA.         EPWORTH SLEEPINESS SCALE:  Scale:  (0)= no chance of dozing; (1)= slight chance of dozing; (2)= moderate chance of dozing; (3)= high chance of dozing  Chance  Situtation    Sitting and  reading: 1    Watching TV: 2    Sitting Inactive in public: 1    As a passenger in car: 3      Lying down to rest: 3    Sitting and talking: 1    Sitting quielty after lunch: 2    In a car, stopped in traffic: 0   TOTAL SCORE:   13 out of 24    SLEEP STUDIES:  01/13/21- HST  AHI 31.1, min SP02 76%   LABS: No results found for this or any previous visit (from the past 2160 hour(s)).  Radiology: No results found.  No results found.  No results found.    Assessment and Plan: Patient Active Problem List   Diagnosis Date Noted   OSA (obstructive sleep apnea) 06/03/2023   Hepatic cirrhosis (HCC) 10/16/2022   Alpha-1-antitrypsin deficiency (HCC) 10/16/2022   Sleep apnea 03/11/2017   History of colonic polyps    Benign neoplasm of descending colon    Cellulitis of left hand 09/12/2016   Obesity (BMI 30.0-34.9) 09/07/2016   GERD (gastroesophageal reflux disease) 09/07/2016   Vitamin D deficiency 08/02/2015   Elevated liver enzymes 08/02/2015   Hypertension 08/01/2015   Benign prostatic hyperplasia 08/01/2015   Genital warts 08/01/2015   Paresthesia and pain of both upper extremities 08/01/2015   Neck pain of over 3 months duration 08/01/2015   FH: heart disease 08/01/2015   FH: alpha 1 antitrypsin deficiency 08/01/2015   Colon polyps 08/01/2015   1. OSA (obstructive sleep apnea) Patient evaluation suggests high risk of sleep disordered breathing due to history of OSA (HST in 2022 with AHI of 31) snoring, gasping, choking, frequent awakening and daytime sleepiness.  Patient has comorbid cardiovascular risk factors including: hypertension which could be exacerbated by pathologic sleep-disordered breathing.  Suggest: PSG  to assess/treat the patient's sleep disordered breathing. The patient was also counselled on weight loss to optimize sleep health.  2. Primary hypertension Hypertension Counseling:   The following hypertensive lifestyle modification were  recommended and discussed:  1. Limiting alcohol intake to less than 1 oz/day of ethanol:(24 oz of beer or 8 oz of wine or 2 oz of 100-proof whiskey). 2. Take baby ASA 81 mg daily. 3. Importance of regular aerobic exercise and losing weight. 4. Reduce dietary saturated fat and cholesterol intake for overall cardiovascular health. 5. Maintaining adequate dietary potassium, calcium, and magnesium intake. 6. Regular monitoring of the blood pressure. 7. Reduce sodium intake to less than 100 mmol/day (less than 2.3 gm of sodium or less than 6 gm of sodium choride)       General Counseling: I have discussed the findings of the evaluation and examination with Beverly.  I have also discussed any further diagnostic evaluation thatmay be needed or ordered today. Daryus verbalizes understanding of the findings of todays visit. We also reviewed his medications today and discussed drug interactions and side effects including but not limited excessive drowsiness and altered mental states. We also discussed that there is always a risk not just to him but also people around him. he has been encouraged to call the office with any questions or concerns that should arise related to todays visit.  No orders of the defined types were placed in this encounter.       I have personally obtained a history, evaluated the patient, evaluated pertinent data, formulated the assessment and plan and placed orders. This patient was seen today by Emmaline Kluver, PA-C in collaboration with Dr. Freda Munro.     Yevonne Pax, MD [facility] Diplomate ABMS Pulmonary and Critical Care Medicine Sleep medicine

## 2023-12-28 NOTE — Progress Notes (Unsigned)
 [facility] [address] [place], [place] [address]  Pulmonary Sleep Medicine   Office Visit Note  Patient Name: Randall Bell DOB: Oct 24, 1955 MRN [id number]    Chief Complaint: Obstructive Sleep Apnea visit  Brief History:  Maliki is seen today for follow up after setup on CPAP @ 6cmH20. The patient has a 2 year history of sleep apnea. Patient is using PAP nightly.  The patient feels rested after sleeping with PAP.  The patient reports benefiting from PAP use. Reported sleepiness is  improved and the Epworth Sleepiness Score is 9 out of 24. The patient will occasionally take naps. The patient complains of the following: none. The compliance download shows 73% compliance with an average use time of 3 hours 58 minutes. The AHI is 1.2  The patient does not complain of limb movements disrupting sleep.  ROS  General: (-) fever, (-) chills, (-) night sweat Nose and Sinuses: (-) nasal stuffiness or itchiness, (-) postnasal drip, (-) nosebleeds, (-) sinus trouble. Mouth and Throat: (-) sore throat, (-) hoarseness. Neck: (-) swollen glands, (-) enlarged thyroid , (-) neck pain. Respiratory: - cough, - shortness of breath, - wheezing. Neurologic: + numbness, + tingling. Psychiatric: - anxiety, - depression   Current Medication: Outpatient Encounter Medications as of 12/30/2023  Medication Sig Note   acetaminophen  (TYLENOL ) 500 MG tablet Take 2 tablets (1,000 mg total) by mouth every 8 (eight) hours as needed. 04/29/2017: PRN   albuterol (VENTOLIN HFA) 108 (90 Base) MCG/ACT inhaler Inhale into the lungs.    atorvastatin (LIPITOR) 40 MG tablet Take 1 tablet by mouth daily.    Cholecalciferol (VITAMIN D3) 3000 units TABS Take 1 tablet by mouth daily.    gabapentin (NEURONTIN) 300 MG capsule Take by mouth.    hydrOXYzine (ATARAX) 25 MG tablet Take by mouth.    losartan -hydrochlorothiazide  (HYZAAR) 100-12.5 MG tablet Take 1 tablet by mouth daily.    Multiple  Vitamins-Minerals (CENTRUM SILVER ADULT 50+ PO) Take 1 tablet by mouth daily. 08/01/2015: Received from: [facility] Received Sig: Take by mouth.   tadalafil (CIALIS) 5 MG tablet Take 5 mg by mouth daily as needed.    traMADol  (ULTRAM ) 50 MG tablet Take by mouth every 6 (six) hours as needed.    [DISCONTINUED] pantoprazole  (PROTONIX ) 40 MG tablet Take 1 tablet (40 mg total) by mouth daily.    [DISCONTINUED] tiZANidine (ZANAFLEX) 4 MG tablet Take 1 tablet by mouth at bedtime. Mundy    No facility-administered encounter medications on file as of 12/30/2023.    Surgical History: Past Surgical History:  Procedure Laterality Date   COLONOSCOPY  2011   repeat in 5 yrs/ polyps- [facility] docs   COLONOSCOPY WITH PROPOFOL  N/A 12/28/2016   Procedure: COLONOSCOPY WITH PROPOFOL ;  Surgeon: Marnee Sink, MD;  Location: [facility];  Service: Endoscopy;  Laterality: N/A;   HERNIA REPAIR     POLYPECTOMY  12/28/2016   Procedure: POLYPECTOMY;  Surgeon: Marnee Sink, MD;  Location: [facility];  Service: Endoscopy;;    Medical History: Past Medical History:  Diagnosis Date   Arthritis    neck, hands   Bilateral hand numbness    pt thinks it may be Carpal Tunnel   GERD (gastroesophageal reflux disease)    Hypertension    Wears dentures    partial upper    Family History: Non contributory to the present illness  Social History: Social History   Socioeconomic History   Marital status: Single    Spouse name:  Not on file   Number of children: Not on file   Years of education: Not on file   Highest education level: Not on file  Occupational History   Not on file  Tobacco Use   Smoking status: Former    Current packs/day: 0.00    Types: Cigarettes    Quit date: 08/20/2014    Years since quitting: 9.3   Smokeless tobacco: Never  Vaping Use   Vaping status: Never Used  Substance and Sexual Activity   Alcohol use: Yes    Alcohol/week: 12.0 standard drinks of alcohol    Types:  12 Cans of beer per week    Comment:     Drug use: No   Sexual activity: Not Currently  Other Topics Concern   Not on file  Social History Narrative   Not on file   Social Drivers of Health   Financial Resource Strain: Low Risk  (12/14/2021)   Received [facility], [facility]   Overall Financial Resource Strain (CARDIA)    Difficulty of Paying Living Expenses: Not hard at all  Food Insecurity: No Food Insecurity (12/14/2021)   Received from [facility], [facility]   Hunger Vital Sign    Worried About Running Out of Food in the Last Year: Never true    Ran Out of Food in the Last Year: Never true  Transportation Needs: No Transportation Needs (12/14/2021)   Received from [facility], [facility]   [facility] - Transportation    Lack of Transportation (Medical): No    Lack of Transportation (Non-Medical): No  Physical Activity: Sufficiently Active (07/28/2021)   Received from [facility], [facility]   Exercise Vital Sign    Days of Exercise per Week: 7 days    Minutes of Exercise per Session: 30 min  Stress: Not on file  Social Connections: Unknown (01/02/2022)   Received from [facility], [facility]   Social Network    Social Network: Not on file  Intimate Partner Violence: Unknown (11/24/2021)   Received from [facility], [facility]   HITS    Physically Hurt: Not on file    Insult or Talk Down To: Not on file    Threaten Physical Harm: Not on file    Scream or Curse: Not on file    Vital Signs: Blood pressure 136/86, pulse 76, resp. rate 16, height 6\' 1"  (1.854 m), weight 248 lb (112.5 kg), SpO2 97%. Body mass index is 32.72 kg/m.    Examination: General Appearance: The patient is well-developed, well-nourished, and in no distress. Neck Circumference: 43cm Skin: Gross inspection of skin unremarkable. Head: normocephalic, no gross deformities. Eyes: no gross deformities noted. ENT: ears appear grossly normal Neurologic:  Alert and oriented. No involuntary movements.  STOP BANG RISK ASSESSMENT S (snore) Have you been told that you snore?     NO   T (tired) Are you often tired, fatigued, or sleepy during the day?   NO  O (obstruction) Do you stop breathing, choke, or gasp during sleep? NO   P (pressure) Do you have or are you being treated for high blood pressure? YES   B (BMI) Is your body index greater than 35 kg/m? NO   A (age) Are you 47 years old or older? YES   N (neck) Do you have a neck circumference greater than 16 inches?   YES   G (gender) Are you a male? YES  TOTAL STOP/BANG "YES" ANSWERS 4       A STOP-Bang score of 2 or less is considered low risk, and a score of 5 or more is high risk for having either moderate or severe OSA. For people who score 3 or 4, doctors may need to perform further assessment to determine how likely they are to have OSA.         EPWORTH SLEEPINESS SCALE:  Scale:  (0)= no chance of dozing; (1)= slight chance of dozing; (2)= moderate chance of dozing; (3)= high chance of dozing  Chance  Situtation    Sitting and reading: 1    Watching TV: 1    Sitting Inactive in public: 1    As a passenger in car: 2      Lying down to rest: 3    Sitting and talking: 0    Sitting quielty after lunch: 1    In a car, stopped in traffic: 0   TOTAL SCORE:   9 out of 24    SLEEP STUDIES:  HST - 01/13/21 - AHI 31.1/hr, min Sp02 76% PSG - 07/02/23 - AHI 11.7/hr, min Sp02 77% CPAP - 07/11/23 - CPAP @ 6cmH20   CPAP COMPLIANCE DATA:  Date Range: 10/24/2023-11/22/2023  Average Daily Use: 4 hours 7 minutes  Median Use: 4 hours 20 minutes  Compliance for > 4 Hours: 73% days  AHI: 1.2 respiratory events per hour  Days Used: 29/30  Mask Leak: 20.8  95th Percentile Pressure: 6cmH20         LABS: No results found for this or any previous visit (from the past 2160 hours).  Radiology: No results found.  No results found.  No results  found.    Assessment and Plan: Patient Active Problem List   Diagnosis Date Noted   CPAP use counseling 12/30/2023   OSA (obstructive sleep apnea) 06/03/2023   Hepatic cirrhosis (HCC) 10/16/2022   Alpha-1-antitrypsin deficiency (HCC) 10/16/2022   Sleep apnea 03/11/2017   History of colonic polyps    Benign neoplasm of descending colon    Cellulitis of left hand 09/12/2016   Obesity (BMI 30.0-34.9) 09/07/2016   GERD (gastroesophageal reflux disease) 09/07/2016   Vitamin D  deficiency 08/02/2015   Elevated liver enzymes 08/02/2015   Hypertension 08/01/2015   Benign prostatic hyperplasia 08/01/2015   Genital warts 08/01/2015   Paresthesia and pain of both upper extremities 08/01/2015   Neck pain of over 3 months duration 08/01/2015   FH: heart disease 08/01/2015   FH: alpha 1 antitrypsin deficiency 08/01/2015   Colon polyps 08/01/2015   1. OSA (obstructive sleep apnea) (Primary) The patient does tolerate PAP and reports  benefit from PAP use. The patient was reminded how to clean equipment and advised to replace supplies routinely. The patient was also counselled on weight loss. The compliance is fair. The AHI is 1.2.   OSA on cpap- controlled. Continue with compliance with pap. CPAP continues to be medically necessary to treat this patient's OSA. F/u 55m   2. CPAP use counseling CPAP Counseling: had a lengthy discussion with the patient regarding the importance of PAP therapy in management of the sleep apnea. Patient appears to understand the risk factor reduction and also understands the risks associated with untreated sleep apnea. Patient will try to make a good faith effort to remain compliant with therapy. Also instructed the patient on proper cleaning of the device including the water  must be changed daily if possible and use of distilled water  is preferred.  Patient understands that the machine should be regularly cleaned with appropriate recommended cleaning solutions that do  not damage the PAP machine for example given white vinegar and water  rinses. Other methods such as ozone treatment may not be as good as these simple methods to achieve cleaning.      General Counseling: I have discussed the findings of the evaluation and examination with Ethanjames.  I have also discussed any further diagnostic evaluation thatmay be needed or ordered today. Natalio verbalizes understanding of the findings of todays visit. We also reviewed his medications today and discussed drug interactions and side effects including but not limited excessive drowsiness and altered mental states. We also discussed that there is always a risk not just to him but also people around him. he has been encouraged to call the office with any questions or concerns that should arise related to todays visit.  No orders of the defined types were placed in this encounter.       I have personally obtained a history, examined the patient, evaluated laboratory and imaging results, formulated the assessment and plan and placed orders. This patient was seen today by Louann Rous, PA-C in collaboration with Dr. Cam Cava.   Cordie Deters, MD [facility] Diplomate ABMS Pulmonary Critical Care Medicine and Sleep Medicine

## 2023-12-30 ENCOUNTER — Ambulatory Visit (INDEPENDENT_AMBULATORY_CARE_PROVIDER_SITE_OTHER): Admitting: Internal Medicine

## 2023-12-30 VITALS — BP 136/86 | HR 76 | Resp 16 | Ht 73.0 in | Wt 248.0 lb

## 2023-12-30 DIAGNOSIS — Z7189 Other specified counseling: Secondary | ICD-10-CM | POA: Diagnosis not present

## 2023-12-30 DIAGNOSIS — G4733 Obstructive sleep apnea (adult) (pediatric): Secondary | ICD-10-CM | POA: Diagnosis not present

## 2023-12-30 NOTE — Patient Instructions (Signed)

## 2024-07-06 ENCOUNTER — Ambulatory Visit
